# Patient Record
Sex: Female | Born: 1935
Health system: Southern US, Community
[De-identification: ages and names within clinical notes are randomized; demographics above are authoritative.]

## PROBLEM LIST (undated history)

## (undated) DIAGNOSIS — K219 Gastro-esophageal reflux disease without esophagitis: Secondary | ICD-10-CM

## (undated) DIAGNOSIS — E785 Hyperlipidemia, unspecified: Secondary | ICD-10-CM

## (undated) DIAGNOSIS — I10 Essential (primary) hypertension: Secondary | ICD-10-CM

## (undated) DIAGNOSIS — H269 Unspecified cataract: Secondary | ICD-10-CM

## (undated) HISTORY — DX: Unspecified cataract: H26.9

## (undated) HISTORY — PX: CATARACT EXTRACTION, BILATERAL: SHX1313

## (undated) HISTORY — PX: APPENDECTOMY: SHX54

## (undated) HISTORY — PX: CHOLECYSTECTOMY: SHX55

## (undated) HISTORY — DX: Hyperlipidemia, unspecified: E78.5

## (undated) HISTORY — PX: BLADDER REPAIR: SHX76

## (undated) HISTORY — DX: Essential (primary) hypertension: I10

---

## 2016-06-22 ENCOUNTER — Encounter: Payer: Self-pay | Admitting: Family

## 2017-11-25 DIAGNOSIS — I10 Essential (primary) hypertension: Secondary | ICD-10-CM | POA: Diagnosis not present

## 2017-11-25 DIAGNOSIS — R739 Hyperglycemia, unspecified: Secondary | ICD-10-CM | POA: Diagnosis not present

## 2017-11-25 DIAGNOSIS — E7841 Elevated Lipoprotein(a): Secondary | ICD-10-CM | POA: Diagnosis not present

## 2017-12-03 DIAGNOSIS — H35443 Age-related reticular degeneration of retina, bilateral: Secondary | ICD-10-CM | POA: Diagnosis not present

## 2017-12-03 DIAGNOSIS — H401131 Primary open-angle glaucoma, bilateral, mild stage: Secondary | ICD-10-CM | POA: Diagnosis not present

## 2017-12-03 DIAGNOSIS — H35373 Puckering of macula, bilateral: Secondary | ICD-10-CM | POA: Diagnosis not present

## 2017-12-03 DIAGNOSIS — Z961 Presence of intraocular lens: Secondary | ICD-10-CM | POA: Diagnosis not present

## 2017-12-03 DIAGNOSIS — Z0101 Encounter for examination of eyes and vision with abnormal findings: Secondary | ICD-10-CM | POA: Diagnosis not present

## 2017-12-26 DIAGNOSIS — R739 Hyperglycemia, unspecified: Secondary | ICD-10-CM | POA: Diagnosis not present

## 2017-12-26 DIAGNOSIS — E7849 Other hyperlipidemia: Secondary | ICD-10-CM | POA: Diagnosis not present

## 2017-12-26 DIAGNOSIS — K219 Gastro-esophageal reflux disease without esophagitis: Secondary | ICD-10-CM | POA: Diagnosis not present

## 2017-12-26 DIAGNOSIS — I10 Essential (primary) hypertension: Secondary | ICD-10-CM | POA: Diagnosis not present

## 2018-05-07 ENCOUNTER — Telehealth: Payer: Self-pay | Admitting: Family

## 2018-05-07 NOTE — Telephone Encounter (Signed)
noted 

## 2018-05-16 ENCOUNTER — Encounter: Payer: Self-pay | Admitting: Family

## 2018-05-16 ENCOUNTER — Telehealth: Payer: Self-pay | Admitting: Family

## 2018-05-16 ENCOUNTER — Ambulatory Visit (INDEPENDENT_AMBULATORY_CARE_PROVIDER_SITE_OTHER): Payer: Medicare HMO | Admitting: Family

## 2018-05-16 VITALS — BP 136/63 | HR 54 | Temp 97.5°F | Ht 59.25 in | Wt 170.6 lb

## 2018-05-16 DIAGNOSIS — K219 Gastro-esophageal reflux disease without esophagitis: Secondary | ICD-10-CM | POA: Insufficient documentation

## 2018-05-16 DIAGNOSIS — Z0000 Encounter for general adult medical examination without abnormal findings: Secondary | ICD-10-CM | POA: Diagnosis not present

## 2018-05-16 DIAGNOSIS — E785 Hyperlipidemia, unspecified: Secondary | ICD-10-CM

## 2018-05-16 DIAGNOSIS — H9193 Unspecified hearing loss, bilateral: Secondary | ICD-10-CM | POA: Diagnosis not present

## 2018-05-16 DIAGNOSIS — E669 Obesity, unspecified: Secondary | ICD-10-CM | POA: Diagnosis not present

## 2018-05-16 DIAGNOSIS — I10 Essential (primary) hypertension: Secondary | ICD-10-CM | POA: Diagnosis not present

## 2018-05-16 NOTE — Progress Notes (Signed)
Subjective:    Patient ID: Carolyn Parsons, female    DOB: 1932-02-27, 84 y.o.   MRN: [id number]  Chief Complaint  Patient presents with  . New Patient (Initial Visit)    lab work   Pt presents to the office today to establish care and CPE. Pt is complaining of worsening of hearing loss. States she has hearing loss since 1992.   Pt states she has recently moved in with her daughter and son-in-law from [place].  Hypertension  This is a chronic problem. The current episode started more than 1 year ago. The problem has been resolved since onset. The problem is controlled. Pertinent negatives include no headaches, malaise/fatigue or shortness of breath. Risk factors for coronary artery disease include dyslipidemia and sedentary lifestyle. The current treatment provides moderate improvement. There is no history of kidney disease, CAD/MI, CVA or heart failure.  Gastroesophageal Reflux  She reports no belching, no coughing or no heartburn. This is a chronic problem. The current episode started more than 1 year ago. The problem occurs occasionally. The problem has been waxing and waning. Risk factors include obesity. She has tried a PPI for the symptoms. The treatment provided moderate relief.  Hyperlipidemia  This is a chronic problem. The current episode started more than 1 year ago. The problem is controlled. Recent lipid tests were reviewed and are normal. Exacerbating diseases include obesity. Pertinent negatives include no shortness of breath. Current antihyperlipidemic treatment includes statins. The current treatment provides moderate improvement of lipids. Risk factors for coronary artery disease include dyslipidemia, diabetes mellitus, hypertension and a sedentary lifestyle.      Review of Systems  Constitutional: Negative for malaise/fatigue.  Respiratory: Negative for cough and shortness of breath.   Gastrointestinal: Negative for heartburn.  Neurological: Negative for headaches.    All other systems reviewed and are negative.  Family History  Problem Relation Age of Onset  . Cancer Mother   . Cancer Father     Social History   Socioeconomic History  . Marital status: Widowed    Spouse name: Not on file  . Number of children: Not on file  . Years of education: Not on file  . Highest education level: Not on file  Occupational History  . Not on file  Social Needs  . Financial resource strain: Not on file  . Food insecurity:    Worry: Not on file    Inability: Not on file  . Transportation needs:    Medical: Not on file    Non-medical: Not on file  Tobacco Use  . Smoking status: Former Research scientist (life sciences)  . Smokeless tobacco: Never Used  Substance and Sexual Activity  . Alcohol use: Never    Frequency: Never  . Drug use: Never  . Sexual activity: Not on file  Lifestyle  . Physical activity:    Days per week: Not on file    Minutes per session: Not on file  . Stress: Not on file  Relationships  . Social connections:    Talks on phone: Not on file    Gets together: Not on file    Attends religious service: Not on file    Active member of club or organization: Not on file    Attends meetings of clubs or organizations: Not on file    Relationship status: Not on file  Other Topics Concern  . Not on file  Social History Narrative  . Not on file       Objective:  Physical Exam  Constitutional: She is oriented to person, place, and time. She appears well-developed and well-nourished. No distress.  HENT:  Head: Normocephalic and atraumatic.  Right Ear: External ear normal.  Left Ear: External ear normal.  Mouth/Throat: Oropharynx is clear and moist.  Eyes: Pupils are equal, round, and reactive to light.  Neck: Normal range of motion. Neck supple. No thyromegaly present.  Cardiovascular: Normal rate, regular rhythm, normal heart sounds and intact distal pulses.  No murmur heard. Pulmonary/Chest: Effort normal and breath sounds normal. No respiratory  distress. She has no wheezes.  Abdominal: Soft. Bowel sounds are normal. She exhibits no distension. There is no tenderness.  Musculoskeletal: Normal range of motion. She exhibits no edema or tenderness.  Neurological: She is alert and oriented to person, place, and time. She has normal reflexes. No cranial nerve deficit.  Skin: Skin is warm and dry.  Psychiatric: She has a normal mood and affect. Her behavior is normal. Judgment and thought content normal.  Vitals reviewed.     BP 136/63   Pulse (!) 54   Temp (!) 97.5 F (36.4 C) (Oral)   Ht 4' 11.25" (1.505 m)   Wt 170 lb 9.6 oz (77.4 kg)   BMI 34.17 kg/m      Assessment & Plan:  Carolyn Parsons comes in today with chief complaint of New Patient (Initial Visit) (lab work)   Diagnosis and orders addressed:  1. Annual physical exam - CBC with Differential/Platelet - CMP14+EGFR - Lipid panel - TSH - Ambulatory referral to Audiology  2. Hyperlipidemia, unspecified hyperlipidemia type - CBC with Differential/Platelet - CMP14+EGFR - Lipid panel  3. Gastroesophageal reflux disease, esophagitis presence not specified - CBC with Differential/Platelet - CMP14+EGFR  4. Essential hypertension - CBC with Differential/Platelet - CMP14+EGFR  5. Obesity (BMI 30.0-34.9) - CBC with Differential/Platelet - CMP14+EGFR  6. Bilateral hearing loss, unspecified hearing loss type - CBC with Differential/Platelet - CMP14+EGFR - Ambulatory referral to Audiology   Labs pending Health Maintenance reviewed- Pt states she is up to date on immunizations, we will update chart when we get her office notes from [place] Diet and exercise encouraged  Follow up plan: 6 months    Evelina Dun, FNP

## 2018-05-16 NOTE — Patient Instructions (Signed)

## 2018-05-17 LAB — CMP14+EGFR
A/G RATIO: 1.6 (ref 1.2–2.2)
ALT: 10 IU/L (ref 0–32)
AST: 15 IU/L (ref 0–40)
Albumin: 4.2 g/dL (ref 3.5–4.7)
Alkaline Phosphatase: 73 IU/L (ref 39–117)
BUN/Creatinine Ratio: 21 (ref 12–28)
BUN: 19 mg/dL (ref 8–27)
Bilirubin Total: 0.5 mg/dL (ref 0.0–1.2)
CALCIUM: 9.2 mg/dL (ref 8.7–10.3)
CHLORIDE: 103 mmol/L (ref 96–106)
CO2: 22 mmol/L (ref 20–29)
Creatinine, Ser: 0.92 mg/dL (ref 0.57–1.00)
GFR calc Af Amer: 67 mL/min/{1.73_m2} (ref >59)
GFR, EST NON AFRICAN AMERICAN: 58 mL/min/{1.73_m2} — AB (ref >59)
Globulin, Total: 2.7 g/dL (ref 1.5–4.5)
Glucose: 104 mg/dL — ABNORMAL HIGH (ref 65–99)
POTASSIUM: 4.9 mmol/L (ref 3.5–5.2)
Sodium: 141 mmol/L (ref 134–144)
Total Protein: 6.9 g/dL (ref 6.0–8.5)

## 2018-05-17 LAB — CBC WITH DIFFERENTIAL/PLATELET
BASOS: 0 %
Basophils Absolute: 0 10*3/uL (ref 0.0–0.2)
EOS (ABSOLUTE): 0.2 10*3/uL (ref 0.0–0.4)
Eos: 2 %
Hematocrit: 41.6 % (ref 34.0–46.6)
Hemoglobin: 14.1 g/dL (ref 11.1–15.9)
IMMATURE GRANULOCYTES: 0 %
Immature Grans (Abs): 0 10*3/uL (ref 0.0–0.1)
Lymphocytes Absolute: 2.1 10*3/uL (ref 0.7–3.1)
Lymphs: 23 %
MCH: 29.8 pg (ref 26.6–33.0)
MCHC: 33.9 g/dL (ref 31.5–35.7)
MCV: 88 fL (ref 79–97)
MONOS ABS: 0.7 10*3/uL (ref 0.1–0.9)
Monocytes: 7 %
NEUTROS PCT: 68 %
Neutrophils Absolute: 6.2 10*3/uL (ref 1.4–7.0)
Platelets: 228 10*3/uL (ref 150–450)
RBC: 4.73 x10E6/uL (ref 3.77–5.28)
RDW: 12.4 % (ref 12.3–15.4)
WBC: 9.2 10*3/uL (ref 3.4–10.8)

## 2018-05-17 LAB — LIPID PANEL
Chol/HDL Ratio: 3.2 ratio (ref 0.0–4.4)
Cholesterol, Total: 116 mg/dL (ref 100–199)
HDL: 36 mg/dL — AB (ref >39)
LDL CALC: 57 mg/dL (ref 0–99)
Triglycerides: 114 mg/dL (ref 0–149)
VLDL CHOLESTEROL CAL: 23 mg/dL (ref 5–40)

## 2018-05-17 LAB — TSH: TSH: 6.62 u[IU]/mL — AB (ref 0.450–4.500)

## 2018-05-19 ENCOUNTER — Other Ambulatory Visit: Payer: Self-pay | Admitting: Family

## 2018-05-19 ENCOUNTER — Other Ambulatory Visit (INDEPENDENT_AMBULATORY_CARE_PROVIDER_SITE_OTHER): Payer: Self-pay

## 2018-05-19 ENCOUNTER — Ambulatory Visit (INDEPENDENT_AMBULATORY_CARE_PROVIDER_SITE_OTHER): Payer: Medicare HMO | Admitting: Orthopaedic Surgery

## 2018-05-19 ENCOUNTER — Ambulatory Visit (INDEPENDENT_AMBULATORY_CARE_PROVIDER_SITE_OTHER): Payer: Medicare HMO

## 2018-05-19 ENCOUNTER — Encounter (INDEPENDENT_AMBULATORY_CARE_PROVIDER_SITE_OTHER): Payer: Self-pay | Admitting: Orthopaedic Surgery

## 2018-05-19 DIAGNOSIS — M5442 Lumbago with sciatica, left side: Secondary | ICD-10-CM | POA: Diagnosis not present

## 2018-05-19 DIAGNOSIS — M4807 Spinal stenosis, lumbosacral region: Secondary | ICD-10-CM

## 2018-05-19 MED ORDER — LEVOTHYROXINE SODIUM 25 MCG PO TABS: 25.0000 ug | ORAL_TABLET | Freq: Every day | ORAL | 1 refills | Status: DC

## 2018-05-19 MED ORDER — METHYLPREDNISOLONE 4 MG PO TABS: ORAL_TABLET | ORAL | 0 refills | Status: DC

## 2018-05-19 MED ORDER — HYDROCODONE-ACETAMINOPHEN 5-325 MG PO TABS: 1.0000 | ORAL_TABLET | Freq: Four times a day (QID) | ORAL | 0 refills | Status: DC | PRN

## 2018-05-19 NOTE — Progress Notes (Signed)
Office Visit Note   Patient: Carolyn Parsons           Date of Birth: 10/07/1932           MRN: [id number] Visit Date: 05/19/2018              Requested by: Junie SpencerHawks, Christy A, FNP [address] [place], [place] [id number] PCP: Junie SpencerHawks, Christy A, FNP   Assessment & Plan: Visit Diagnoses:  1. Acute left-sided low back pain with left-sided sciatica     Plan: Based on her clinical exam findings and plain film findings I am recommending an MRI of the lumbar spine to assess for nerve compression that can hopefully help guide wearing intervention should be performed.  She will likely need some type of nerve root injection at most likely L4-L5 level that corresponds with her pain and clinical exam findings.  In the interim I will put her on a 6-day steroid taper and some hydrocodone for pain.  All question concerns were answered and addressed.  Obviously if this becomes more severe acute and she develops rapid weakness we should see her earlier or she should go to the emergency room.  Otherwise we will see her back in 2 weeks to go the MRI.  Follow-Up Instructions: Return in about 2 weeks (around 06/02/2018).   Orders:  Orders Placed This Encounter  Procedures  . XR Lumbar Spine 2-3 Views   Meds ordered this encounter  Medications  . methylPREDNISolone (MEDROL) 4 MG tablet    Sig: Medrol dose pack. Take as instructed    Dispense:  21 tablet    Refill:  0  . HYDROcodone-acetaminophen (NORCO/VICODIN) 5-325 MG tablet    Sig: Take 1-2 tablets by mouth every 6 (six) hours as needed for moderate pain.    Dispense:  30 tablet    Refill:  0      Procedures: No procedures performed   Clinical Data: No additional findings.   Subjective: Chief Complaint  Patient presents with  . Left Hip - Pain  Patient is a very pleasant 84 year old I am seeing for the first time in our office.  Her family though is well-known to the office and they have seen Dr. Ophelia CharterYates and Dr. Alvester MorinNewton before.  She is  someone is acute left-sided low back pain and left-sided sciatica.  She points to her sciatic region is a source of her pain but it does radiate to behind the knee and into the top of her foot.  This is come on more suddenly over the last few days.  She is an active individual.  She is only been living in [place] about [place] days now.  She is not a diabetic and denies any acute injury.  Her pain got severe enough last night she could not sleep well.  HPI  Review of Systems She currently denies any headache, chest pain, shortness of breath, fever, chills, nausea, vomiting.  Objective: Vital Signs: There were no vitals taken for this visit.  Physical Exam She is alert and oriented x3 and in no acute distress but obvious discomfort.  She is ambulating without any type of assistive device. Ortho Exam Examination of her low back and bilateral lower extremity shows a positive straight leg raise to the left side.  She has decreased sensation of the lateral aspect of her left leg and the top of her left foot when these are compared to her opposite side.  She has good strength though it are  equal in the bilateral lower extremities and normal reflexes. Specialty Comments:  No specialty comments available.  Imaging: Xr Lumbar Spine 2-3 Views  Result Date: 05/19/2018 An AP and lateral lumbar spine shows a grade 1 spondylolisthesis at L4-L5.  There is degenerative disc disease and significant arthritic changes at L5-S1.    PMFS History: Patient Active Problem List   Diagnosis Date Noted  . Hyperlipemia 05/16/2018  . GERD (gastroesophageal reflux disease) 05/16/2018  . Hypertension 05/16/2018  . Obesity (BMI 30.0-34.9) 05/16/2018   Past Medical History:  Diagnosis Date  . Cataract   . Hyperlipidemia   . Hypertension     Family History  Problem Relation Age of Onset  . Cancer Mother   . Cancer Father     Past Surgical History:  Procedure Laterality Date  . APPENDECTOMY    .  CHOLECYSTECTOMY     Social History   Occupational History  . Not on file  Tobacco Use  . Smoking status: Former Games developer  . Smokeless tobacco: Never Used  Substance and Sexual Activity  . Alcohol use: Never    Frequency: Never  . Drug use: Never  . Sexual activity: Not on file

## 2018-05-23 ENCOUNTER — Other Ambulatory Visit: Payer: Self-pay | Admitting: *Deleted

## 2018-05-23 DIAGNOSIS — R7989 Other specified abnormal findings of blood chemistry: Secondary | ICD-10-CM

## 2018-05-26 ENCOUNTER — Ambulatory Visit
Admission: RE | Admit: 2018-05-26 | Discharge: 2018-05-26 | Disposition: A | Discharge status: Home / Self Care | Payer: Medicare HMO | Source: Ambulatory Visit | Attending: Orthopaedic Surgery | Admitting: Orthopaedic Surgery

## 2018-05-26 DIAGNOSIS — M48061 Spinal stenosis, lumbar region without neurogenic claudication: Secondary | ICD-10-CM | POA: Diagnosis not present

## 2018-05-26 DIAGNOSIS — M4807 Spinal stenosis, lumbosacral region: Secondary | ICD-10-CM

## 2018-05-28 ENCOUNTER — Ambulatory Visit (INDEPENDENT_AMBULATORY_CARE_PROVIDER_SITE_OTHER): Payer: Medicare HMO | Admitting: Physician Assistant

## 2018-05-28 ENCOUNTER — Encounter (INDEPENDENT_AMBULATORY_CARE_PROVIDER_SITE_OTHER): Payer: Self-pay | Admitting: Physician Assistant

## 2018-05-28 ENCOUNTER — Ambulatory Visit (INDEPENDENT_AMBULATORY_CARE_PROVIDER_SITE_OTHER): Payer: Medicare HMO

## 2018-05-28 DIAGNOSIS — M545 Low back pain: Secondary | ICD-10-CM

## 2018-05-28 NOTE — Progress Notes (Signed)
Office Visit Note   Patient: Carolyn Parsons           Date of Birth: 11/12/1931           MRN: [id number] Visit Date: 05/28/2018              Requested by: Junie SpencerHawks, Christy A, FNP [address] [place], [place] [id number] PCP: Junie SpencerHawks, Christy A, FNP   Assessment & Plan: Visit Diagnoses:  1. Acute low back pain, unspecified back pain laterality, with sciatica presence unspecified     Plan: We will send her to Dr. Alvester MorinNewton for lumbar epidural steroid injection.  Have her follow-up with Dr. Magnus IvanBlackman 4 weeks after the injection see her response.  Questions encouraged and answered at length today both the patient and her daughters present throughout the examination and review of MRI.  Follow-Up Instructions: Return for 4 weeks after epidural steroid injection.   Orders:  Orders Placed This Encounter  Procedures  . XR Lumbar Spine 2-3 Views   No orders of the defined types were placed in this encounter.     Procedures: No procedures performed   Clinical Data: No additional findings.   Subjective: Chief Complaint  Patient presents with  . Lower Back - Follow-up  . MRI    HPI Carolyn Parsons is an 84 year old female was seen back today to go over MRI of her lumbar spine.  She states that her back pain is keeping her awake at night.  She is mostly having pain down the lateral aspect of her left thigh into her lower leg and into the anterior aspect of her left ankle.  She does have an occasional sharp burning pain anterior left lower leg.  No numbness tingling down either leg.  Pain is worse with walking.  Rarely has any back pain.  No change in bowel bladder function she does have some urinary incontinence.  MRI of the lumbar spine is reviewed with the patient using a spine model also the MRI images are used for visualization.  Disc space narrowing is seen at L1-2 and L2-3.  At L1 -L2 central disc extrusion with mild stenosis possible left L2 nerve or impingement.  L2-3 central disc  protrusion right greater than left L3 neural impingement.  L3-4 central disc protrusion moderate stenosis.  Bilateral L4 neural impingement.  L4-5 5 mm anterolisthesis.  Mild to moderate stenosis.   Review of Systems Please see HPI otherwise negative Objective: Vital Signs: There were no vitals taken for this visit.  Physical Exam  Constitutional: She is oriented to person, place, and time. She appears well-developed and well-nourished. No distress.  Pulmonary/Chest: Effort normal.  Neurological: She is alert and oriented to person, place, and time.  Skin: She is not diaphoretic.  Psychiatric: She has a normal mood and affect.    Ortho Exam Bilateral lower extremities she has positive straight leg raise on the left negative on the right.  5 out of 5 strength throughout lower extremities.  Subjective decreased sensation over the left anterior ankle. Specialty Comments:  No specialty comments available.  Imaging: Xr Lumbar Spine 2-3 Views  Result Date: 05/28/2018 Lateral flexion-extension views of the lumbar spine: L4-L5 spondylolisthesis shows no sign of instability.    PMFS History: Patient Active Problem List   Diagnosis Date Noted  . Hyperlipemia 05/16/2018  . GERD (gastroesophageal reflux disease) 05/16/2018  . Hypertension 05/16/2018  . Obesity (BMI 30.0-34.9) 05/16/2018   Past Medical History:  Diagnosis Date  . Cataract   .  Hyperlipidemia   . Hypertension     Family History  Problem Relation Age of Onset  . Cancer Mother   . Cancer Father     Past Surgical History:  Procedure Laterality Date  . APPENDECTOMY    . CHOLECYSTECTOMY     Social History   Occupational History  . Not on file  Tobacco Use  . Smoking status: Former Games developer  . Smokeless tobacco: Never Used  Substance and Sexual Activity  . Alcohol use: Never    Frequency: Never  . Drug use: Never  . Sexual activity: Not on file

## 2018-05-30 ENCOUNTER — Other Ambulatory Visit (INDEPENDENT_AMBULATORY_CARE_PROVIDER_SITE_OTHER): Payer: Self-pay

## 2018-05-30 DIAGNOSIS — G8929 Other chronic pain: Secondary | ICD-10-CM

## 2018-05-30 DIAGNOSIS — M545 Low back pain: Principal | ICD-10-CM

## 2018-06-02 ENCOUNTER — Ambulatory Visit (INDEPENDENT_AMBULATORY_CARE_PROVIDER_SITE_OTHER): Payer: Medicare HMO | Admitting: Physician Assistant

## 2018-06-02 ENCOUNTER — Telehealth (INDEPENDENT_AMBULATORY_CARE_PROVIDER_SITE_OTHER): Payer: Self-pay | Admitting: *Deleted

## 2018-06-02 NOTE — Telephone Encounter (Signed)
Please advise 

## 2018-06-03 ENCOUNTER — Other Ambulatory Visit (INDEPENDENT_AMBULATORY_CARE_PROVIDER_SITE_OTHER): Payer: Self-pay

## 2018-06-03 MED ORDER — HYDROCODONE-ACETAMINOPHEN 5-325 MG PO TABS: 1.0000 | ORAL_TABLET | Freq: Four times a day (QID) | ORAL | 0 refills | Status: DC | PRN

## 2018-06-03 NOTE — Telephone Encounter (Signed)
norco 5/325 mg one - two po q 4-6 hrs prn pain #30 zero

## 2018-06-03 NOTE — Telephone Encounter (Signed)
Patient daughter aware this Rx is ready at front desk

## 2018-06-17 ENCOUNTER — Encounter

## 2018-06-17 ENCOUNTER — Ambulatory Visit (INDEPENDENT_AMBULATORY_CARE_PROVIDER_SITE_OTHER): Payer: Self-pay

## 2018-06-17 ENCOUNTER — Ambulatory Visit (INDEPENDENT_AMBULATORY_CARE_PROVIDER_SITE_OTHER): Payer: Medicare HMO | Admitting: Physical Medicine and Rehabilitation

## 2018-06-17 ENCOUNTER — Encounter (INDEPENDENT_AMBULATORY_CARE_PROVIDER_SITE_OTHER): Payer: Self-pay | Admitting: Physical Medicine and Rehabilitation

## 2018-06-17 VITALS — BP 172/97 | HR 80

## 2018-06-17 DIAGNOSIS — M5416 Radiculopathy, lumbar region: Secondary | ICD-10-CM

## 2018-06-17 MED ORDER — BETAMETHASONE SOD PHOS & ACET 6 (3-3) MG/ML IJ SUSP
12.0000 mg | Freq: Once | INTRAMUSCULAR | Status: AC
  Administered 2018-06-17: 12 mg

## 2018-06-17 NOTE — Patient Instructions (Signed)

## 2018-06-17 NOTE — Progress Notes (Signed)
 .  Numeric Pain Rating Scale and Functional Assessment Average Pain 6   In the last MONTH (on 0-10 scale) has pain interfered with the following?  1. General activity like being  able to carry out your everyday physical activities such as walking, climbing stairs, carrying groceries, or moving a chair?  Rating(4)   +Driver, -BT, -Dye Allergies.  

## 2018-06-26 ENCOUNTER — Telehealth (INDEPENDENT_AMBULATORY_CARE_PROVIDER_SITE_OTHER): Payer: Self-pay | Admitting: Physical Medicine and Rehabilitation

## 2018-06-26 ENCOUNTER — Telehealth (INDEPENDENT_AMBULATORY_CARE_PROVIDER_SITE_OTHER): Payer: Self-pay | Admitting: *Deleted

## 2018-06-26 NOTE — Progress Notes (Signed)
Carolyn StacksFreda Parsons - 84 y.o. female MRN [id number]  Date of birth: 05/29/1932  Office Visit Note: Visit Date: 06/17/2018 PCP: Junie SpencerHawks, Christy A, FNP Referred by: Junie SpencerHawks, Christy A, FNP  Subjective: Chief Complaint  Patient presents with  . Lower Back - Pain  . Left Leg - Pain  . Left Foot - Pain   HPI: Carolyn Parsons is an 84 year old female who comes in today at the request of Dr. Doneen Poissonhristopher Blackman and Rexene EdisonGil Clark, P.A.-C for transforaminal epidural steroid injection diagnostically and therapeutically for ongoing chronic recalcitrant radicular leg pain.  She reports pain in the lower back that radiates in the left leg and left foot and more of an L5 distribution with paresthesias.  She has not noted any focal weakness of foot drop.  She reports it started in July 2019.  She is failed conservative care.  She reports walking makes the pain worse.  They did obtain MRI of the lumbar spine which in the upper lumbar region shows more right-sided disc issues but there is moderate stenosis at L3-4 multifactorial.  Mild to moderate at L4-5 with left to central protrusion but no focal compression.  She does have a sacralized L5 segment.  There is facet arthropathy and arthritis.  Based on symptoms we are going to complete a left L4 transforaminal epidural steroid injection based on a sacralized L5 segment.   ROS Otherwise per HPI.  Assessment & Plan: Visit Diagnoses:  1. Lumbar radiculopathy     Plan: No additional findings.   Meds & Orders:  Meds ordered this encounter  Medications  . betamethasone acetate-betamethasone sodium phosphate (CELESTONE) injection 12 mg    Orders Placed This Encounter  Procedures  . XR C-ARM NO REPORT  . Epidural Steroid injection    Follow-up: Return if symptoms worsen or fail to improve.   Procedures: No procedures performed  Lumbosacral Transforaminal Epidural Steroid Injection - Sub-Pedicular Approach with Fluoroscopic Guidance  Patient: Carolyn Parsons      Date  of Birth: 07/26/1932 MRN: [id number] PCP: Junie SpencerHawks, Christy A, FNP      Visit Date: 06/17/2018   Universal Protocol:    Date/Time: 06/17/2018  Consent Given By: the patient  Position: PRONE  Additional Comments: Vital signs were monitored before and after the procedure. Patient was prepped and draped in the usual sterile fashion. The correct patient, procedure, and site was verified.   Injection Procedure Details:  Procedure Site One Meds Administered:  Meds ordered this encounter  Medications  . betamethasone acetate-betamethasone sodium phosphate (CELESTONE) injection 12 mg    Laterality: Left  Location/Site: Sacralized L5 segment L4-L5  Needle size: 22 G  Needle type: Spinal  Needle Placement: Transforaminal  Findings:    -Comments: Excellent flow of contrast along the nerve and into the epidural space.  Procedure Details: After squaring off the end-plates to get a true AP view, the C-arm was positioned so that an oblique view of the foramen as noted above was visualized. The target area is just inferior to the "nose of the scotty dog" or sub pedicular. The soft tissues overlying this structure were infiltrated with 2-3 ml. of 1% Lidocaine without Epinephrine.  The spinal needle was inserted toward the target using a "trajectory" view along the fluoroscope beam.  Under AP and lateral visualization, the needle was advanced so it did not puncture dura and was located close the 6 O'Clock position of the pedical in AP tracterory. Biplanar projections were used to confirm position. Aspiration was confirmed to be negative  for CSF and/or blood. A 1-2 ml. volume of Isovue-250 was injected and flow of contrast was noted at each level. Radiographs were obtained for documentation purposes.   After attaining the desired flow of contrast documented above, a 0.5 to 1.0 ml test dose of 0.25% Marcaine was injected into each respective transforaminal space.  The patient was observed for 90  seconds post injection.  After no sensory deficits were reported, and normal lower extremity motor function was noted,   the above injectate was administered so that equal amounts of the injectate were placed at each foramen (level) into the transforaminal epidural space.   Additional Comments:  The patient tolerated the procedure well Dressing: Band-Aid    Post-procedure details: Patient was observed during the procedure. Post-procedure instructions were reviewed.  Patient left the clinic in stable condition.    Clinical History: MRI LUMBAR SPINE WITHOUT CONTRAST  TECHNIQUE: Multiplanar, multisequence MR imaging of the lumbar spine was performed. No intravenous contrast was administered.  COMPARISON:  Plain film 05/19/2018.  FINDINGS: Segmentation:  L5 is sacralized.  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  Alignment:  Degenerative anterolisthesis L4 on L5 measures 5 mm.  Vertebrae:  No worrisome osseous lesion.  Endplate reactive changes.  Conus medullaris and cauda equina: Conus extends to the L1 level. Conus and cauda equina appear normal.  Paraspinal and other soft tissues: Negative.  Disc levels:  L1-L2: Disc space narrowing. Central disc extrusion. Mild stenosis. Possible LEFT L2 neural impingement. Disc material does extend into the foramen, but clear-cut L1 neural impingement not established.  L2-L3: Disc space narrowing. Central protrusion. Posterior element hypertrophy. RIGHT greater than LEFT L3 neural impingement.  L3-L4: Disc space narrowing. Central protrusion. Posterior element hypertrophy. Moderate stenosis. BILATERAL L4 neural impingement. Disc material extends into both foramina, and there also and extraforaminal disc herniation on the RIGHT. RIGHT L3 neural impingement is likely.  L4-L5: 5 mm anterolisthesis. Uncovering of the disc with mild bulge. Posterior element hypertrophy. Mild to moderate stenosis. No definite subarticular zone or foraminal  zone narrowing.  L5-S1:  Rudimentary disc space.  No impingement.  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  Mild to moderate stenosis at L4-5 due to 5 mm slip, posterior element hypertrophy, and uncovering of the disc with mild bulge. No definite subarticular zone or foraminal zone narrowing. Consider lateral standing flexion extension lumbar radiographs to evaluate for dynamic instability.  Moderate stenosis at L3-4 related to disc space narrowing, central protrusion, and posterior element hypertrophy. BILATERAL L4 neural impingement in the canal, with additional RIGHT L3 neural impingement likely due to extraforaminal disc herniation.  Central protrusion at L2-3, RIGHT greater than LEFT L3 neural impingement.  Central extrusion L1-2, possible LEFT L2 neural impingement.   Electronically Signed   By: Elsie Stain M.D.   On: 05/26/2018 14:38   She reports that she has quit smoking. She has never used smokeless tobacco. No results for input(s): HGBA1C, LABURIC in the last 8760 hours.  Objective:  VS:  HT:    WT:   BMI:     BP:(!) 172/97  HR:80bpm  TEMP: ( )  RESP:  Physical Exam  Ortho Exam Imaging: No results found.  Past Medical/Family/Surgical/Social History: Medications & Allergies reviewed per EMR, new medications updated. Patient Active Problem List   Diagnosis Date Noted  . Hyperlipemia 05/16/2018  . GERD (gastroesophageal reflux disease) 05/16/2018  . Hypertension 05/16/2018  . Obesity (BMI 30.0-34.9) 05/16/2018   Past Medical History:  Diagnosis Date  . Cataract   .  Hyperlipidemia   . Hypertension    Family History  Problem Relation Age of Onset  . Cancer Mother   . Cancer Father    Past Surgical History:  Procedure Laterality Date  . APPENDECTOMY    . CHOLECYSTECTOMY     Social History   Occupational History  . Not on file  Tobacco Use  . Smoking status: Former Games developer  . Smokeless tobacco: Never Used  Substance and  Sexual Activity  . Alcohol use: Never    Frequency: Never  . Drug use: Never  . Sexual activity: Not on file

## 2018-06-26 NOTE — Telephone Encounter (Signed)
Please advise 

## 2018-06-26 NOTE — Telephone Encounter (Signed)
Find out if she wants tramadol or Tylenol 3.  We would not put her on anything stronger at this point.  Either would be 1-2 every 8 hours as needed for pain and only 30 pills.

## 2018-06-26 NOTE — Telephone Encounter (Signed)
Authorization [id number]:[id number] Auth Effective Date:06/26/2018 Auth End Date:09/24/2018

## 2018-06-26 NOTE — Procedures (Signed)
Lumbosacral Transforaminal Epidural Steroid Injection - Sub-Pedicular Approach with Fluoroscopic Guidance  Patient: Carolyn StacksFreda Parsons      Date of Birth: 06/08/1932 MRN: [id number] PCP: Junie SpencerHawks, Christy A, FNP      Visit Date: 06/17/2018   Universal Protocol:    Date/Time: 06/17/2018  Consent Given By: the patient  Position: PRONE  Additional Comments: Vital signs were monitored before and after the procedure. Patient was prepped and draped in the usual sterile fashion. The correct patient, procedure, and site was verified.   Injection Procedure Details:  Procedure Site One Meds Administered:  Meds ordered this encounter  Medications  . betamethasone acetate-betamethasone sodium phosphate (CELESTONE) injection 12 mg    Laterality: Left  Location/Site: Sacralized L5 segment L4-L5  Needle size: 22 G  Needle type: Spinal  Needle Placement: Transforaminal  Findings:    -Comments: Excellent flow of contrast along the nerve and into the epidural space.  Procedure Details: After squaring off the end-plates to get a true AP view, the C-arm was positioned so that an oblique view of the foramen as noted above was visualized. The target area is just inferior to the "nose of the scotty dog" or sub pedicular. The soft tissues overlying this structure were infiltrated with 2-3 ml. of 1% Lidocaine without Epinephrine.  The spinal needle was inserted toward the target using a "trajectory" view along the fluoroscope beam.  Under AP and lateral visualization, the needle was advanced so it did not puncture dura and was located close the 6 O'Clock position of the pedical in AP tracterory. Biplanar projections were used to confirm position. Aspiration was confirmed to be negative for CSF and/or blood. A 1-2 ml. volume of Isovue-250 was injected and flow of contrast was noted at each level. Radiographs were obtained for documentation purposes.   After attaining the desired flow of contrast  documented above, a 0.5 to 1.0 ml test dose of 0.25% Marcaine was injected into each respective transforaminal space.  The patient was observed for 90 seconds post injection.  After no sensory deficits were reported, and normal lower extremity motor function was noted,   the above injectate was administered so that equal amounts of the injectate were placed at each foramen (level) into the transforaminal epidural space.   Additional Comments:  The patient tolerated the procedure well Dressing: Band-Aid    Post-procedure details: Patient was observed during the procedure. Post-procedure instructions were reviewed.  Patient left the clinic in stable condition.

## 2018-06-26 NOTE — Telephone Encounter (Signed)
Repeat but do two level as patient has transitional sacralized L5

## 2018-06-26 NOTE — Telephone Encounter (Signed)
Needs auth for [id number] and [id number].

## 2018-06-27 ENCOUNTER — Other Ambulatory Visit (INDEPENDENT_AMBULATORY_CARE_PROVIDER_SITE_OTHER): Payer: Self-pay

## 2018-06-27 MED ORDER — TRAMADOL HCL 50 MG PO TABS: 50.0000 mg | ORAL_TABLET | Freq: Three times a day (TID) | ORAL | 0 refills | Status: DC | PRN

## 2018-06-27 NOTE — Telephone Encounter (Signed)
Sent into pharmacy  Patient aware

## 2018-07-11 ENCOUNTER — Encounter (INDEPENDENT_AMBULATORY_CARE_PROVIDER_SITE_OTHER): Payer: Self-pay | Admitting: Physical Medicine and Rehabilitation

## 2018-07-11 ENCOUNTER — Ambulatory Visit (INDEPENDENT_AMBULATORY_CARE_PROVIDER_SITE_OTHER): Payer: Medicare HMO | Admitting: Physical Medicine and Rehabilitation

## 2018-07-11 ENCOUNTER — Ambulatory Visit (INDEPENDENT_AMBULATORY_CARE_PROVIDER_SITE_OTHER): Payer: Self-pay

## 2018-07-11 VITALS — BP 163/79 | HR 77

## 2018-07-11 DIAGNOSIS — M48062 Spinal stenosis, lumbar region with neurogenic claudication: Secondary | ICD-10-CM

## 2018-07-11 DIAGNOSIS — M5416 Radiculopathy, lumbar region: Secondary | ICD-10-CM | POA: Diagnosis not present

## 2018-07-11 MED ORDER — BETAMETHASONE SOD PHOS & ACET 6 (3-3) MG/ML IJ SUSP
12.0000 mg | Freq: Once | INTRAMUSCULAR | Status: AC
  Administered 2018-07-11: 12 mg

## 2018-07-11 NOTE — Progress Notes (Signed)
.  Numeric Pain Rating Scale and Functional Assessment Average Pain 6   In the last MONTH (on 0-10 scale) has pain interfered with the following?  1. General activity like being  able to carry out your everyday physical activities such as walking, climbing stairs, carrying groceries, or moving a chair?  Rating(5)   +Driver, -BT, -Dye Allergies.  

## 2018-07-11 NOTE — Patient Instructions (Signed)

## 2018-07-23 ENCOUNTER — Other Ambulatory Visit: Payer: Self-pay | Admitting: Family

## 2018-07-24 ENCOUNTER — Other Ambulatory Visit: Payer: Medicare HMO

## 2018-07-24 DIAGNOSIS — R7989 Other specified abnormal findings of blood chemistry: Secondary | ICD-10-CM | POA: Diagnosis not present

## 2018-07-24 DIAGNOSIS — I10 Essential (primary) hypertension: Secondary | ICD-10-CM | POA: Diagnosis not present

## 2018-07-24 NOTE — Progress Notes (Signed)
Carolyn Parsons - 83 y.o. female MRN [id number]  Date of birth: 06-Aug-1932  Office Visit Note: Visit Date: 07/11/2018 PCP: [name], FNP Referred by: [name], FNP  Subjective: No chief complaint on file.  HPI: Carolyn Parsons is an 84 year old female who comes in today with continued low back pain.  She is followed by of Dr. Doneen Poisson and [name], P.A.-C for orthopedic complaints.  She reports pain in the lower back.  She has not noted any focal weakness of foot drop.  She reports it started in July 2019.  She is failed conservative care.  She reports walking makes the pain worse.  She has had physical therapy.  She does use mild opioid medication.  She reports that the left transforaminal injection at L4 provided significant relief of her leg pain and paresthesia.  She no longer really having much of that.  She still gets some thigh pain with walking and low back pain with standing.  Relief at rest.  I think the best approach is bilateral L3 transforaminal injection just to see if one more injection at the level of stenosis will get her good relief.  She has a transitional segment.  Depending on relief of back pain would look at diagnostic medial branch blocks with goal towards radiofrequency ablation.  She has concordant pain with facet joint loading and standing.  She has had ongoing pain for many months worsening chronically for a long time.  MRI reviewed below.   ROS Otherwise per HPI.  Assessment & Plan: Visit Diagnoses:  1. Lumbar radiculopathy   2. Spinal stenosis of lumbar region with neurogenic claudication     Plan: No additional findings.   Meds & Orders:  Meds ordered this encounter  Medications  . betamethasone acetate-betamethasone sodium phosphate (CELESTONE) injection 12 mg    Orders Placed This Encounter  Procedures  . XR C-ARM NO REPORT  . Epidural Steroid injection    Follow-up: Return if symptoms worsen or fail to improve, for consider facet  block.   Procedures: No procedures performed  Lumbosacral Transforaminal Epidural Steroid Injection - Sub-Pedicular Approach with Fluoroscopic Guidance  Patient: Carolyn Parsons      Date of Birth: 08-28-1932 MRN: [id number] PCP: [name], FNP      Visit Date: 07/11/2018   Universal Protocol:    Date/Time: 07/11/2018  Consent Given By: the patient  Position: PRONE  Additional Comments: Vital signs were monitored before and after the procedure. Patient was prepped and draped in the usual sterile fashion. The correct patient, procedure, and site was verified.   Injection Procedure Details:  Procedure Site One Meds Administered:  Meds ordered this encounter  Medications  . betamethasone acetate-betamethasone sodium phosphate (CELESTONE) injection 12 mg    Laterality: Bilateral  Location/Site: Sacralized L5 segment L3-L4  Needle size: 22 G  Needle type: Spinal  Needle Placement: Transforaminal  Findings:    -Comments: Excellent flow of contrast along the nerve and into the epidural space.  Procedure Details: After squaring off the end-plates to get a true AP view, the C-arm was positioned so that an oblique view of the foramen as noted above was visualized. The target area is just inferior to the "nose of the scotty dog" or sub pedicular. The soft tissues overlying this structure were infiltrated with 2-3 ml. of 1% Lidocaine without Epinephrine.  The spinal needle was inserted toward the target using a "trajectory" view along the fluoroscope beam.  Under AP and lateral visualization, the  needle was advanced so it did not puncture dura and was located close the 6 O'Clock position of the pedical in AP tracterory. Biplanar projections were used to confirm position. Aspiration was confirmed to be negative for CSF and/or blood. A 1-2 ml. volume of Isovue-250 was injected and flow of contrast was noted at each level. Radiographs were obtained for documentation purposes.    After attaining the desired flow of contrast documented above, a 0.5 to 1.0 ml test dose of 0.25% Marcaine was injected into each respective transforaminal space.  The patient was observed for 90 seconds post injection.  After no sensory deficits were reported, and normal lower extremity motor function was noted,   the above injectate was administered so that equal amounts of the injectate were placed at each foramen (level) into the transforaminal epidural space.   Additional Comments:  The patient tolerated the procedure well Dressing: Band-Aid    Post-procedure details: Patient was observed during the procedure. Post-procedure instructions were reviewed.  Patient left the clinic in stable condition.     Clinical History: MRI LUMBAR SPINE WITHOUT CONTRAST  TECHNIQUE: Multiplanar, multisequence MR imaging of the lumbar spine was performed. No intravenous contrast was administered.  COMPARISON:  Plain film 05/19/2018.  FINDINGS: Segmentation:  L5 is sacralized.  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  Alignment:  Degenerative anterolisthesis L4 on L5 measures 5 mm.  Vertebrae:  No worrisome osseous lesion.  Endplate reactive changes.  Conus medullaris and cauda equina: Conus extends to the L1 level. Conus and cauda equina appear normal.  Paraspinal and other soft tissues: Negative.  Disc levels:  L1-L2: Disc space narrowing. Central disc extrusion. Mild stenosis. Possible LEFT L2 neural impingement. Disc material does extend into the foramen, but clear-cut L1 neural impingement not established.  L2-L3: Disc space narrowing. Central protrusion. Posterior element hypertrophy. RIGHT greater than LEFT L3 neural impingement.  L3-L4: Disc space narrowing. Central protrusion. Posterior element hypertrophy. Moderate stenosis. BILATERAL L4 neural impingement. Disc material extends into both foramina, and there also and extraforaminal disc herniation on the RIGHT. RIGHT L3  neural impingement is likely.  L4-L5: 5 mm anterolisthesis. Uncovering of the disc with mild bulge. Posterior element hypertrophy. Mild to moderate stenosis. No definite subarticular zone or foraminal zone narrowing.  L5-S1:  Rudimentary disc space.  No impingement.  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  Mild to moderate stenosis at L4-5 due to 5 mm slip, posterior element hypertrophy, and uncovering of the disc with mild bulge. No definite subarticular zone or foraminal zone narrowing. Consider lateral standing flexion extension lumbar radiographs to evaluate for dynamic instability.  Moderate stenosis at L3-4 related to disc space narrowing, central protrusion, and posterior element hypertrophy. BILATERAL L4 neural impingement in the canal, with additional RIGHT L3 neural impingement likely due to extraforaminal disc herniation.  Central protrusion at L2-3, RIGHT greater than LEFT L3 neural impingement.  Central extrusion L1-2, possible LEFT L2 neural impingement.   Electronically Signed   By: Elsie Stain M.D.   On: 05/26/2018 14:38   She reports that she has quit smoking. She has never used smokeless tobacco. No results for input(s): HGBA1C, LABURIC in the last 8760 hours.  Objective:  VS:  HT:    WT:   BMI:     BP:(!) 163/79  HR:77bpm  TEMP: ( )  RESP:  Physical Exam  Ortho Exam Imaging: No results found.  Past Medical/Family/Surgical/Social History: Medications & Allergies reviewed per EMR, new medications updated. Patient Active Problem List   Diagnosis  Date Noted  . Hyperlipemia 05/16/2018  . GERD (gastroesophageal reflux disease) 05/16/2018  . Hypertension 05/16/2018  . Obesity (BMI 30.0-34.9) 05/16/2018   Past Medical History:  Diagnosis Date  . Cataract   . Hyperlipidemia   . Hypertension    Family History  Problem Relation Age of Onset  . Cancer Mother   . Cancer Father    Past Surgical History:  Procedure  Laterality Date  . APPENDECTOMY    . CHOLECYSTECTOMY     Social History   Occupational History  . Not on file  Tobacco Use  . Smoking status: Former Games developer  . Smokeless tobacco: Never Used  Substance and Sexual Activity  . Alcohol use: Never    Frequency: Never  . Drug use: Never  . Sexual activity: Not on file

## 2018-07-24 NOTE — Procedures (Signed)
Lumbosacral Transforaminal Epidural Steroid Injection - Sub-Pedicular Approach with Fluoroscopic Guidance  Patient: Carolyn Parsons      Date of Birth: [birth year] MRN: [id number] PCP: [name], FNP      Visit Date: 07/11/2018   Universal Protocol:    Date/Time: 07/11/2018  Consent Given By: the patient  Position: PRONE  Additional Comments: Vital signs were monitored before and after the procedure. Patient was prepped and draped in the usual sterile fashion. The correct patient, procedure, and site was verified.   Injection Procedure Details:  Procedure Site One Meds Administered:  Meds ordered this encounter  Medications  . betamethasone acetate-betamethasone sodium phosphate (CELESTONE) injection 12 mg    Laterality: Bilateral  Location/Site: Sacralized L5 segment L3-L4  Needle size: 22 G  Needle type: Spinal  Needle Placement: Transforaminal  Findings:    -Comments: Excellent flow of contrast along the nerve and into the epidural space.  Procedure Details: After squaring off the end-plates to get a true AP view, the C-arm was positioned so that an oblique view of the foramen as noted above was visualized. The target area is just inferior to the "nose of the scotty dog" or sub pedicular. The soft tissues overlying this structure were infiltrated with 2-3 ml. of 1% Lidocaine without Epinephrine.  The spinal needle was inserted toward the target using a "trajectory" view along the fluoroscope beam.  Under AP and lateral visualization, the needle was advanced so it did not puncture dura and was located close the 6 O'Clock position of the pedical in AP tracterory. Biplanar projections were used to confirm position. Aspiration was confirmed to be negative for CSF and/or blood. A 1-2 ml. volume of Isovue-250 was injected and flow of contrast was noted at each level. Radiographs were obtained for documentation purposes.   After attaining the desired flow of contrast  documented above, a 0.5 to 1.0 ml test dose of 0.25% Marcaine was injected into each respective transforaminal space.  The patient was observed for 90 seconds post injection.  After no sensory deficits were reported, and normal lower extremity motor function was noted,   the above injectate was administered so that equal amounts of the injectate were placed at each foramen (level) into the transforaminal epidural space.   Additional Comments:  The patient tolerated the procedure well Dressing: Band-Aid    Post-procedure details: Patient was observed during the procedure. Post-procedure instructions were reviewed.  Patient left the clinic in stable condition.

## 2018-07-25 LAB — TSH: TSH: 3.23 u[IU]/mL (ref 0.450–4.500)

## 2018-07-28 ENCOUNTER — Telehealth: Payer: Self-pay | Admitting: Family

## 2018-07-28 NOTE — Telephone Encounter (Signed)
Aware, per voice mail left on her phone, yes, can have flu immunization now.  Call us to schedule an appointment .

## 2018-07-28 NOTE — Telephone Encounter (Signed)
PT is scheduled to have VM

## 2018-07-31 ENCOUNTER — Telehealth: Payer: Self-pay | Admitting: Family

## 2018-07-31 NOTE — Telephone Encounter (Signed)
Normal thyroid result. Left message to call for any concerns.

## 2018-08-04 ENCOUNTER — Ambulatory Visit: Payer: Medicare HMO

## 2018-08-07 ENCOUNTER — Ambulatory Visit (INDEPENDENT_AMBULATORY_CARE_PROVIDER_SITE_OTHER): Payer: Medicare HMO

## 2018-08-07 DIAGNOSIS — Z23 Encounter for immunization: Secondary | ICD-10-CM | POA: Diagnosis not present

## 2018-09-21 ENCOUNTER — Other Ambulatory Visit: Payer: Self-pay | Admitting: Family

## 2018-11-04 ENCOUNTER — Encounter: Payer: Self-pay | Admitting: Family Medicine

## 2018-11-04 ENCOUNTER — Ambulatory Visit: Payer: Medicare HMO | Admitting: Family Medicine

## 2018-11-04 VITALS — BP 164/60 | HR 55 | Temp 97.3°F | Ht 59.25 in | Wt 175.5 lb

## 2018-11-04 DIAGNOSIS — J329 Chronic sinusitis, unspecified: Secondary | ICD-10-CM

## 2018-11-04 DIAGNOSIS — J40 Bronchitis, not specified as acute or chronic: Secondary | ICD-10-CM | POA: Diagnosis not present

## 2018-11-04 MED ORDER — LEVOFLOXACIN 500 MG PO TABS: 500.0000 mg | ORAL_TABLET | Freq: Every day | ORAL | 0 refills | Status: DC

## 2018-11-04 MED ORDER — BETAMETHASONE SOD PHOS & ACET 6 (3-3) MG/ML IJ SUSP
6.0000 mg | Freq: Once | INTRAMUSCULAR | Status: AC
  Administered 2018-11-04: 6 mg via INTRAMUSCULAR

## 2018-11-04 NOTE — Progress Notes (Signed)
Chief Complaint  Patient presents with  . Cough    pt here today c/o cough and congestion    HPI  Patient presents today for Patient presents with upper respiratory congestion. Rhinorrhea that is frequently purulent. There is moderate sore throat. Patient reports coughing frequently as well.  yellow sputum noted. There is no fever, chills or sweats. The patient denies being short of breath. Onset was 3-5 days ago. Gradually worsening. Tried OTCs without improvement.  PMH: Smoking status noted ROS: Per HPI  Objective: BP (!) 164/60   Pulse (!) 55   Temp (!) 97.3 F (36.3 C) (Oral)   Ht 4' 11.25" (1.505 m)   Wt 175 lb 8 oz (79.6 kg)   BMI 35.15 kg/m  Gen: NAD, alert, cooperative with exam HEENT: NCAT, Nasal passages swollen, red TMS RED CV: RRR, good S1/S2, no murmur Resp: Bronchitis changes with scattered wheezes, non-labored Ext: No edema, warm Neuro: Alert and oriented, No gross deficits  Assessment and plan:  1. Sinobronchitis     Meds ordered this encounter  Medications  . betamethasone acetate-betamethasone sodium phosphate (CELESTONE) injection 6 mg  . levofloxacin (LEVAQUIN) 500 MG tablet    Sig: Take 1 tablet (500 mg total) by mouth daily.    Dispense:  7 tablet    Refill:  0    No orders of the defined types were placed in this encounter.   Follow up as needed.  Mechele Claude, MD

## 2018-11-14 ENCOUNTER — Encounter: Payer: Self-pay | Admitting: Family

## 2018-11-14 ENCOUNTER — Ambulatory Visit: Payer: Medicare HMO | Admitting: Family

## 2018-11-14 VITALS — BP 137/59 | HR 53 | Temp 97.0°F | Ht 59.5 in | Wt 174.8 lb

## 2018-11-14 DIAGNOSIS — K219 Gastro-esophageal reflux disease without esophagitis: Secondary | ICD-10-CM | POA: Diagnosis not present

## 2018-11-14 DIAGNOSIS — E669 Obesity, unspecified: Secondary | ICD-10-CM

## 2018-11-14 DIAGNOSIS — E785 Hyperlipidemia, unspecified: Secondary | ICD-10-CM | POA: Diagnosis not present

## 2018-11-14 DIAGNOSIS — E039 Hypothyroidism, unspecified: Secondary | ICD-10-CM

## 2018-11-14 DIAGNOSIS — I10 Essential (primary) hypertension: Secondary | ICD-10-CM

## 2018-11-14 MED ORDER — AMLODIPINE BESYLATE 5 MG PO TABS: 5.0000 mg | ORAL_TABLET | Freq: Every day | ORAL | 3 refills | Status: DC

## 2018-11-14 MED ORDER — ROSUVASTATIN CALCIUM 20 MG PO TABS: 20.0000 mg | ORAL_TABLET | Freq: Every day | ORAL | 3 refills | Status: DC

## 2018-11-14 MED ORDER — METOPROLOL SUCCINATE ER 25 MG PO TB24: 25.0000 mg | ORAL_TABLET | Freq: Every day | ORAL | 3 refills | Status: DC

## 2018-11-14 NOTE — Progress Notes (Signed)
Subjective:    Patient ID: Carolyn Parsons, female    DOB: [birth year], 85 y.o.   MRN: [id number]  Chief Complaint  Patient presents with  . Medical Management of Chronic Issues    refills   Pt presents to the office today to establish care and CPE. Pt is complaining of worsening of hearing loss. States she has hearing loss since 1992.   Pt states she has recently moved in with her daughter and son-in-law from [place]. Hypertension  This is a chronic problem. The current episode started more than 1 year ago. The problem has been resolved since onset. The problem is controlled. Pertinent negatives include no headaches, peripheral edema or shortness of breath. Risk factors for coronary artery disease include dyslipidemia and obesity. The current treatment provides moderate improvement. There is no history of kidney disease or heart failure. Identifiable causes of hypertension include a thyroid problem.  Gastroesophageal Reflux  She reports no belching, no coughing, no heartburn or no hoarse voice. This is a chronic problem. The current episode started more than 1 year ago. The problem occurs occasionally. The problem has been waxing and waning. Pertinent negatives include no fatigue. Risk factors include obesity. She has tried an antacid for the symptoms. The treatment provided moderate relief.  Hyperlipidemia  This is a chronic problem. The current episode started more than 1 year ago. The problem is controlled. Recent lipid tests were reviewed and are normal. Exacerbating diseases include obesity. Pertinent negatives include no shortness of breath. Current antihyperlipidemic treatment includes statins. The current treatment provides moderate improvement of lipids. Risk factors for coronary artery disease include dyslipidemia and a sedentary lifestyle.  Thyroid Problem  Presents for follow-up visit. Patient reports no constipation, diarrhea, fatigue or hoarse voice. The symptoms have been stable.  Her past medical history is significant for hyperlipidemia. There is no history of heart failure.      Review of Systems  Constitutional: Negative for fatigue.  HENT: Negative for hoarse voice.   Respiratory: Negative for cough and shortness of breath.   Gastrointestinal: Negative for constipation, diarrhea and heartburn.  Neurological: Negative for headaches.  All other systems reviewed and are negative.      Objective:   Physical Exam Vitals signs reviewed.  Constitutional:      General: She is not in acute distress.    Appearance: She is well-developed.  HENT:     Head: Normocephalic and atraumatic.     Right Ear: Tympanic membrane normal.     Left Ear: Tympanic membrane normal.  Eyes:     Pupils: Pupils are equal, round, and reactive to light.  Neck:     Musculoskeletal: Normal range of motion and neck supple.     Thyroid: No thyromegaly.  Cardiovascular:     Rate and Rhythm: Normal rate and regular rhythm.     Heart sounds: Normal heart sounds. No murmur.  Pulmonary:     Effort: Pulmonary effort is normal. No respiratory distress.     Breath sounds: Normal breath sounds. No wheezing.  Abdominal:     General: Bowel sounds are normal. There is no distension.     Palpations: Abdomen is soft.     Tenderness: There is no abdominal tenderness.  Musculoskeletal: Normal range of motion.        General: No tenderness.     Right lower leg: Edema (trace) present.     Left lower leg: Edema (trace) present.  Skin:    General: Skin is warm and  dry.  Neurological:     Mental Status: She is alert and oriented to person, place, and time.     Cranial Nerves: No cranial nerve deficit.     Deep Tendon Reflexes: Reflexes are normal and symmetric.  Psychiatric:        Behavior: Behavior normal.        Thought Content: Thought content normal.        Judgment: Judgment normal.       BP (!) 137/59   Pulse (!) 53   Temp (!) 97 F (36.1 C) (Oral)   Ht 4' 11.5" (1.511 m)   Wt  174 lb 12.8 oz (79.3 kg)   BMI 34.71 kg/m      Assessment & Plan:  Carolyn Parsons comes in today with chief complaint of Medical Management of Chronic Issues (refills)   Diagnosis and orders addressed:  1. Essential hypertension - amLODipine (NORVASC) 5 MG tablet; Take 1 tablet (5 mg total) by mouth daily.  Dispense: 90 tablet; Refill: 3 - metoprolol succinate (TOPROL-XL) 25 MG 24 hr tablet; Take 1 tablet (25 mg total) by mouth daily.  Dispense: 90 tablet; Refill: 3 - CMP14+EGFR - CBC with Differential/Platelet  2. Gastroesophageal reflux disease, esophagitis presence not specified - CMP14+EGFR - CBC with Differential/Platelet  3. Hyperlipidemia, unspecified hyperlipidemia type - rosuvastatin (CRESTOR) 20 MG tablet; Take 1 tablet (20 mg total) by mouth daily.  Dispense: 90 tablet; Refill: 3 - CMP14+EGFR - CBC with Differential/Platelet - Lipid panel  4. Obesity (BMI 30.0-34.9) - CMP14+EGFR - CBC with Differential/Platelet  5. Hypothyroidism, unspecified type - - CMP14+EGFR - CBC with Differential/Platelet - TSH   Labs pending Health Maintenance reviewed Diet and exercise encouraged  Follow up plan: 6 months    Evelina Dun, FNP

## 2018-11-14 NOTE — Patient Instructions (Signed)
Hypothyroidism  Hypothyroidism is when the thyroid gland does not make enough of certain hormones (it is underactive). The thyroid gland is a small gland located in the lower front part of the neck, just in front of the windpipe (trachea). This gland makes hormones that help control how the body uses food for energy (metabolism) as well as how the heart and brain function. These hormones also play a role in keeping your bones strong. When the thyroid is underactive, it produces too little of the hormones thyroxine (T4) and triiodothyronine (T3). What are the causes? This condition may be caused by:  Hashimoto's disease. This is a disease in which the body's disease-fighting system (immune system) attacks the thyroid gland. This is the most common cause.  Viral infections.  Pregnancy.  Certain medicines.  Birth defects.  Past radiation treatments to the head or neck for cancer.  Past treatment with radioactive iodine.  Past exposure to radiation in the environment.  Past surgical removal of part or all of the thyroid.  Problems with a gland in the center of the brain (pituitary gland).  Lack of enough iodine in the diet. What increases the risk? You are more likely to develop this condition if:  You are female.  You have a family history of thyroid conditions.  You use a medicine called lithium.  You take medicines that affect the immune system (immunosuppressants). What are the signs or symptoms? Symptoms of this condition include:  Feeling as though you have no energy (lethargy).  Not being able to tolerate cold.  Weight gain that is not explained by a change in diet or exercise habits.  Lack of appetite.  Dry skin.  Coarse hair.  Menstrual irregularity.  Slowing of thought processes.  Constipation.  Sadness or depression. How is this diagnosed? This condition may be diagnosed based on:  Your symptoms, your medical history, and a physical exam.  Blood  tests. You may also have imaging tests, such as an ultrasound or MRI. How is this treated? This condition is treated with medicine that replaces the thyroid hormones that your body does not make. After you begin treatment, it may take several weeks for symptoms to go away. Follow these instructions at home:  Take over-the-counter and prescription medicines only as told by your health care provider.  If you start taking any new medicines, tell your health care provider.  Keep all follow-up visits as told by your health care provider. This is important. ? As your condition improves, your dosage of thyroid hormone medicine may change. ? You will need to have blood tests regularly so that your health care provider can monitor your condition. Contact a health care provider if:  Your symptoms do not get better with treatment.  You are taking thyroid replacement medicine and you: ? Sweat a lot. ? Have tremors. ? Feel anxious. ? Lose weight rapidly. ? Cannot tolerate heat. ? Have emotional swings. ? Have diarrhea. ? Feel weak. Get help right away if you have:  Chest pain.  An irregular heartbeat.  A rapid heartbeat.  Difficulty breathing. Summary  Hypothyroidism is when the thyroid gland does not make enough of certain hormones (it is underactive).  When the thyroid is underactive, it produces too little of the hormones thyroxine (T4) and triiodothyronine (T3).  The most common cause is Hashimoto's disease, a disease in which the body's disease-fighting system (immune system) attacks the thyroid gland. The condition can also be caused by viral infections, medicine, pregnancy, or past   radiation treatment to the head or neck.  Symptoms may include weight gain, dry skin, constipation, feeling as though you do not have energy, and not being able to tolerate cold.  This condition is treated with medicine to replace the thyroid hormones that your body does not make. This information  is not intended to replace advice given to you by your health care provider. Make sure you discuss any questions you have with your health care provider. Document Released: 10/15/2005 Document Revised: 09/25/2017 Document Reviewed: 09/25/2017 Elsevier Interactive Patient Education  2019 [employer]  

## 2018-11-15 LAB — CMP14+EGFR
ALT: 12 IU/L (ref 0–32)
AST: 15 IU/L (ref 0–40)
Albumin/Globulin Ratio: 1.6 (ref 1.2–2.2)
Albumin: 3.8 g/dL (ref 3.5–4.7)
Alkaline Phosphatase: 69 IU/L (ref 39–117)
BUN/Creatinine Ratio: 19 (ref 12–28)
BUN: 15 mg/dL (ref 8–27)
Bilirubin Total: 0.4 mg/dL (ref 0.0–1.2)
CO2: 24 mmol/L (ref 20–29)
Calcium: 9.3 mg/dL (ref 8.7–10.3)
Chloride: 102 mmol/L (ref 96–106)
Creatinine, Ser: 0.81 mg/dL (ref 0.57–1.00)
GFR calc Af Amer: 77 mL/min/{1.73_m2} (ref >59)
GFR calc non Af Amer: 67 mL/min/{1.73_m2} (ref >59)
GLOBULIN, TOTAL: 2.4 g/dL (ref 1.5–4.5)
Glucose: 91 mg/dL (ref 65–99)
Potassium: 4.8 mmol/L (ref 3.5–5.2)
SODIUM: 140 mmol/L (ref 134–144)
Total Protein: 6.2 g/dL (ref 6.0–8.5)

## 2018-11-15 LAB — CBC WITH DIFFERENTIAL/PLATELET
Basophils Absolute: 0 10*3/uL (ref 0.0–0.2)
Basos: 0 %
EOS (ABSOLUTE): 0.2 10*3/uL (ref 0.0–0.4)
Eos: 3 %
Hematocrit: 44.4 % (ref 34.0–46.6)
Hemoglobin: 15 g/dL (ref 11.1–15.9)
Immature Grans (Abs): 0.1 10*3/uL (ref 0.0–0.1)
Immature Granulocytes: 1 %
LYMPHS ABS: 2 10*3/uL (ref 0.7–3.1)
Lymphs: 21 %
MCH: 30.5 pg (ref 26.6–33.0)
MCHC: 33.8 g/dL (ref 31.5–35.7)
MCV: 90 fL (ref 79–97)
Monocytes Absolute: 0.8 10*3/uL (ref 0.1–0.9)
Monocytes: 8 %
Neutrophils Absolute: 6.3 10*3/uL (ref 1.4–7.0)
Neutrophils: 67 %
Platelets: 224 10*3/uL (ref 150–450)
RBC: 4.92 x10E6/uL (ref 3.77–5.28)
RDW: 12.1 % (ref 11.7–15.4)
WBC: 9.3 10*3/uL (ref 3.4–10.8)

## 2018-11-15 LAB — LIPID PANEL
Chol/HDL Ratio: 3.4 ratio (ref 0.0–4.4)
Cholesterol, Total: 122 mg/dL (ref 100–199)
HDL: 36 mg/dL — AB (ref >39)
LDL Calculated: 64 mg/dL (ref 0–99)
TRIGLYCERIDES: 112 mg/dL (ref 0–149)
VLDL Cholesterol Cal: 22 mg/dL (ref 5–40)

## 2018-11-15 LAB — TSH: TSH: 3.18 u[IU]/mL (ref 0.450–4.500)

## 2018-11-18 ENCOUNTER — Encounter: Payer: Self-pay | Admitting: Family

## 2018-11-18 ENCOUNTER — Ambulatory Visit (INDEPENDENT_AMBULATORY_CARE_PROVIDER_SITE_OTHER): Payer: Medicare HMO

## 2018-11-18 ENCOUNTER — Ambulatory Visit: Payer: Medicare HMO | Admitting: Family

## 2018-11-18 VITALS — BP 169/64 | HR 64 | Temp 97.0°F | Ht 59.5 in | Wt 174.8 lb

## 2018-11-18 DIAGNOSIS — R05 Cough: Secondary | ICD-10-CM

## 2018-11-18 DIAGNOSIS — R059 Cough, unspecified: Secondary | ICD-10-CM

## 2018-11-18 DIAGNOSIS — J209 Acute bronchitis, unspecified: Secondary | ICD-10-CM

## 2018-11-18 DIAGNOSIS — R0989 Other specified symptoms and signs involving the circulatory and respiratory systems: Secondary | ICD-10-CM | POA: Diagnosis not present

## 2018-11-18 DIAGNOSIS — J301 Allergic rhinitis due to pollen: Secondary | ICD-10-CM | POA: Diagnosis not present

## 2018-11-18 MED ORDER — PREDNISONE 10 MG (21) PO TBPK: ORAL_TABLET | ORAL | 0 refills | Status: DC

## 2018-11-18 MED ORDER — CETIRIZINE HCL 10 MG PO TABS: 10.0000 mg | ORAL_TABLET | Freq: Every day | ORAL | 11 refills | Status: DC

## 2018-11-18 MED ORDER — FLUTICASONE PROPIONATE 50 MCG/ACT NA SUSP: 2.0000 | Freq: Every day | NASAL | 6 refills | Status: DC

## 2018-11-18 NOTE — Progress Notes (Signed)
Subjective:    Patient ID: Carolyn Parsons, female    DOB: 11-12-1931, 85 y.o.   MRN: [id number]  Chief Complaint  Patient presents with  . Cough    Cough  This is a new problem. The current episode started 1 to 4 weeks ago. The problem has been waxing and waning. The problem occurs every few minutes. The cough is non-productive. Associated symptoms include chills, nasal congestion and rhinorrhea. Pertinent negatives include no ear congestion, ear pain, sore throat, shortness of breath or wheezing. The symptoms are aggravated by lying down. She has tried rest and OTC cough suppressant for the symptoms. The treatment provided mild relief. There is no history of asthma.      Review of Systems  Constitutional: Positive for chills.  HENT: Positive for rhinorrhea. Negative for ear pain and sore throat.   Respiratory: Positive for cough. Negative for shortness of breath and wheezing.   All other systems reviewed and are negative.      Objective:   Physical Exam Vitals signs reviewed.  Constitutional:      General: She is not in acute distress.    Appearance: She is well-developed.  HENT:     Head: Normocephalic and atraumatic.     Right Ear: Tympanic membrane normal.     Left Ear: Tympanic membrane normal.     Nose: Mucosal edema present.     Mouth/Throat:     Pharynx: Posterior oropharyngeal erythema present.  Eyes:     Pupils: Pupils are equal, round, and reactive to light.  Neck:     Musculoskeletal: Normal range of motion and neck supple.     Thyroid: No thyromegaly.  Cardiovascular:     Rate and Rhythm: Normal rate and regular rhythm.     Heart sounds: Normal heart sounds. No murmur.  Pulmonary:     Effort: Pulmonary effort is normal. No respiratory distress.     Breath sounds: Normal breath sounds. No wheezing.  Abdominal:     General: Bowel sounds are normal. There is no distension.     Palpations: Abdomen is soft.     Tenderness: There is no abdominal tenderness.    Musculoskeletal: Normal range of motion.        General: No tenderness.  Skin:    General: Skin is warm and dry.  Neurological:     Mental Status: She is alert and oriented to person, place, and time.     Cranial Nerves: No cranial nerve deficit.     Deep Tendon Reflexes: Reflexes are normal and symmetric.  Psychiatric:        Behavior: Behavior normal.        Thought Content: Thought content normal.        Judgment: Judgment normal.       BP (!) 169/64   Pulse 64   Temp (!) 97 F (36.1 C) (Oral)   Ht 4' 11.5" (1.511 m)   Wt 174 lb 12.8 oz (79.3 kg)   BMI 34.71 kg/m      Assessment & Plan:  Carolyn Parsons comes in today with chief complaint of Cough   Diagnosis and orders addressed:  1. Cough We will change Claritin to Zyrtec  Restart flonase - cetirizine (ZYRTEC) 10 MG tablet; Take 1 tablet (10 mg total) by mouth daily.  Dispense: 30 tablet; Refill: 11 - DG Chest 2 View; Future  2. Allergic rhinitis due to pollen, unspecified seasonality - cetirizine (ZYRTEC) 10 MG tablet; Take 1 tablet (10 mg total)  by mouth daily.  Dispense: 30 tablet; Refill: 11 - DG Chest 2 View; Future - fluticasone (FLONASE) 50 MCG/ACT nasal spray; Place 2 sprays into both nostrils daily.  Dispense: 16 g; Refill: 6  3. Acute bronchitis, unspecified organism - Take meds as prescribed - Use a cool mist humidifier  -Use saline nose sprays frequently -Force fluids -For any cough or congestion  Use plain Mucinex- regular strength or max strength is fine -For fever or aces or pains- take tylenol or ibuprofen. -Throat lozenges if helps - predniSONE (STERAPRED UNI-PAK 21 TAB) 10 MG (21) TBPK tablet; Use as directed  Dispense: 21 tablet; Refill: 0  Carolyn Rodneyhristy Katalyn Matin, FNP

## 2018-11-18 NOTE — Patient Instructions (Signed)

## 2018-11-19 IMAGING — MR MR LUMBAR SPINE W/O CM
5 series · 48 of 48 positions shown · non-contrast
Comparison: Plain film 05/19/2018.

CLINICAL DATA: Low back and LEFT leg pain.  No specific injury.

EXAM:
MRI LUMBAR SPINE WITHOUT CONTRAST
TECHNIQUE: Multiplanar, multisequence MR imaging of the lumbar spine was
performed. No intravenous contrast was administered.

[Series 3: T2 · sagittal · 4.0mm · 0.88mm/px · 4 of 12 slices shown (1 of 2)]
[im 1/12]
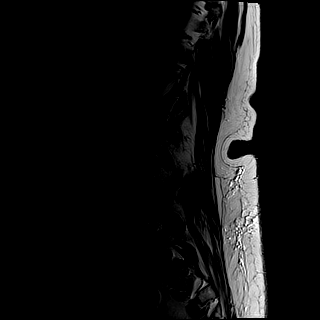
[im 4/12]
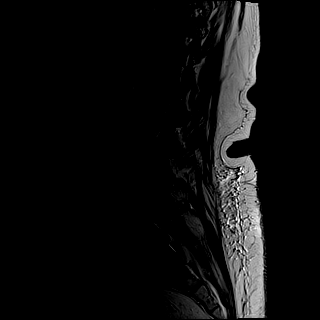
[im 8/12]
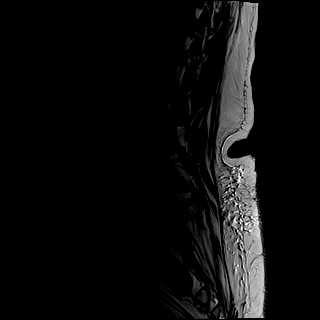
[im 12/12]
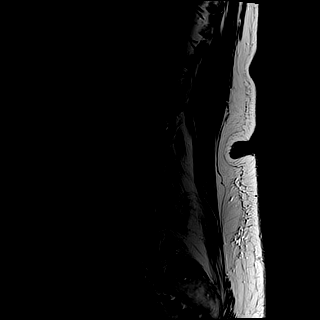

[Series 4: tirm sag · sagittal · 4.0mm · 0.88mm/px · 5 of 12 slices shown]
[im 1/12]
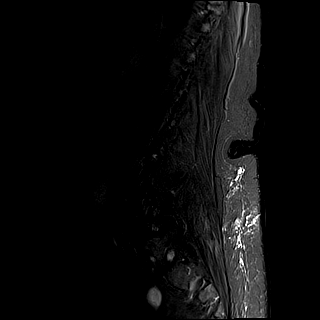
[im 3/12]
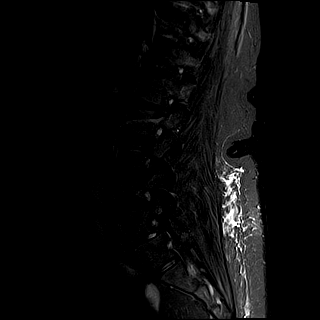
[im 6/12]
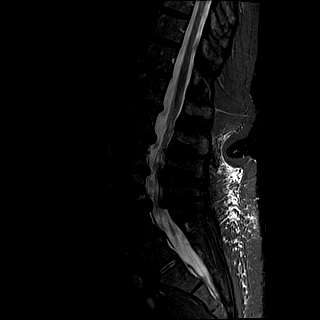
[im 9/12]
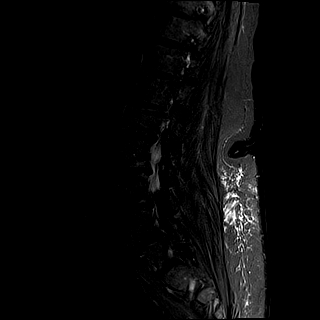
[im 12/12]
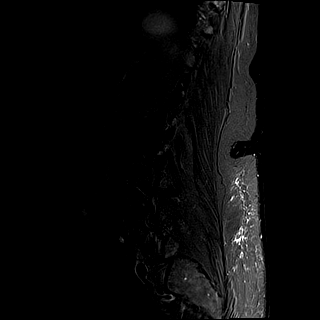

[Series 5: T1 · sagittal · 4.0mm · 0.88mm/px · 5 of 12 slices shown (1 of 2)]
[im 1/12]
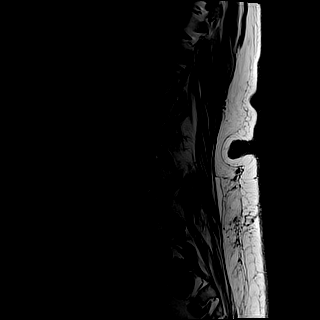
[im 3/12]
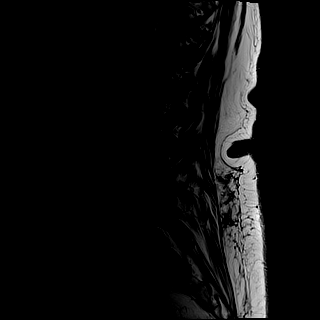
[im 6/12]
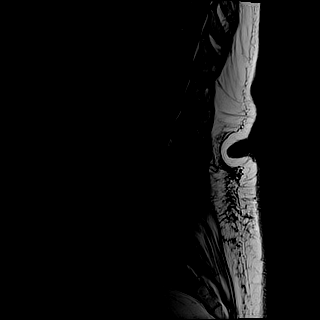
[im 9/12]
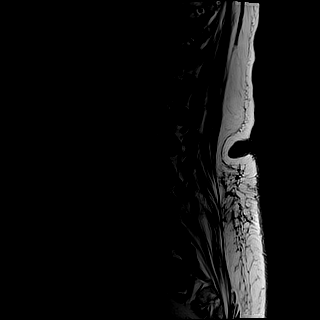
[im 12/12]
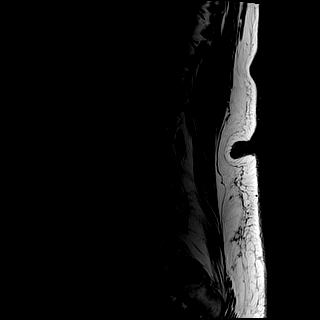

[Series 10: T1 · axial · 4.0mm · 0.70mm/px · z∈[-55,+165]mm · 17 of 40 slices shown (2 of 2)]
[im 1/40]
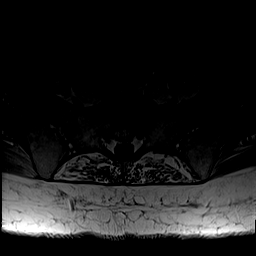
[im 3/40]
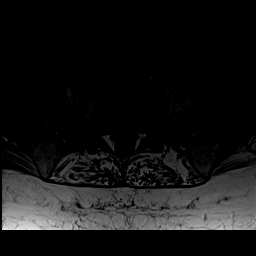
[im 5/40]
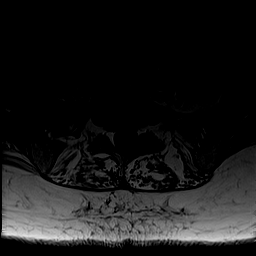
[im 8/40]
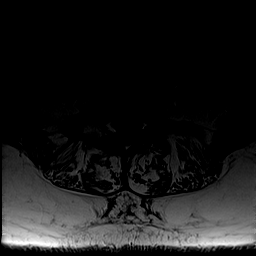
[im 10/40]
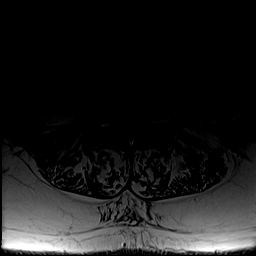
[im 13/40]
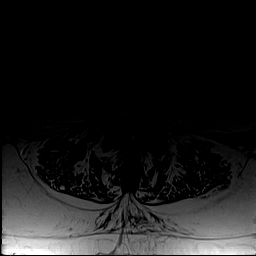
[im 15/40]
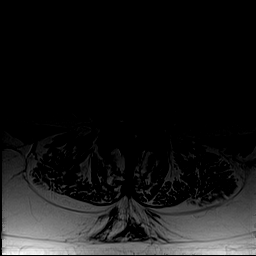
[im 18/40]
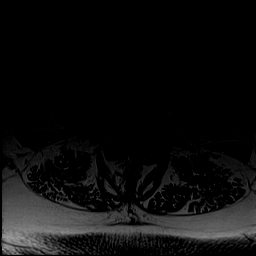
[im 20/40]
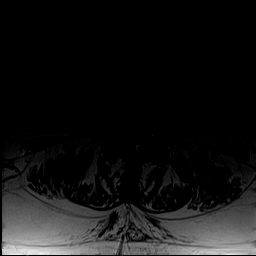
[im 22/40]
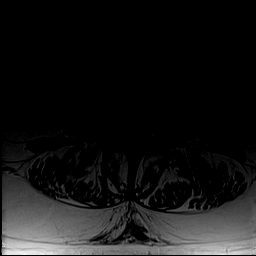
[im 25/40]
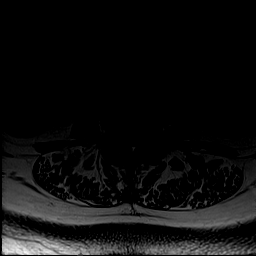
[im 27/40]
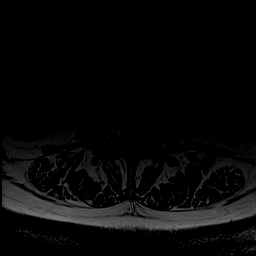
[im 30/40]
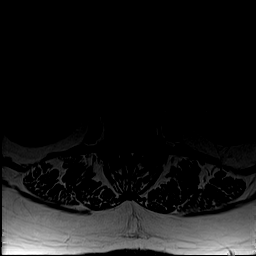
[im 32/40]
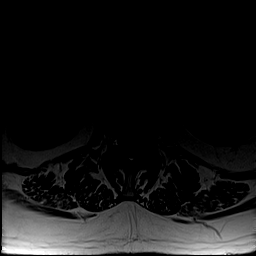
[im 35/40]
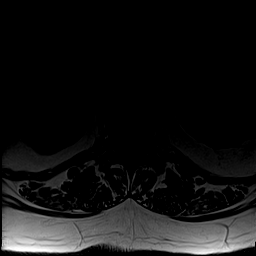
[im 37/40]
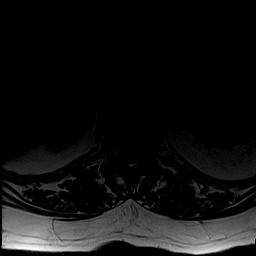
[im 40/40]
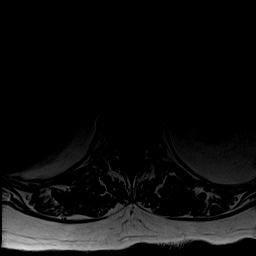

[Series 15: T2 · axial · 4.0mm · 0.78mm/px · z∈[-59,+166]mm · 17 of 40 slices shown (2 of 2)]
[im 1/40]
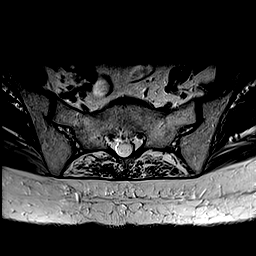
[im 3/40]
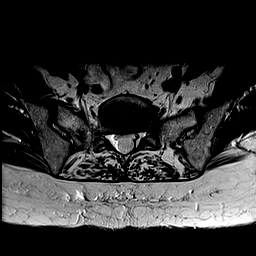
[im 5/40]
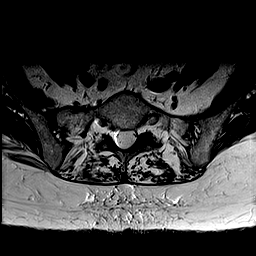
[im 8/40]
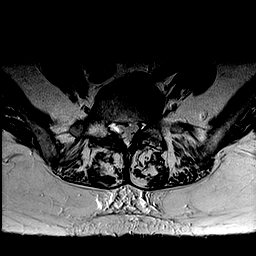
[im 10/40]
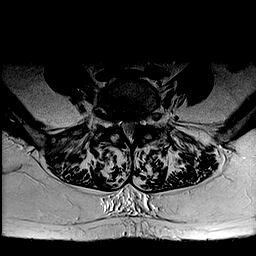
[im 13/40]
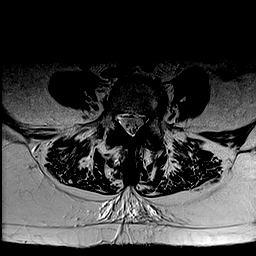
[im 15/40]
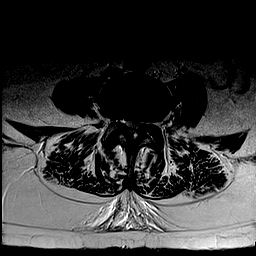
[im 18/40]
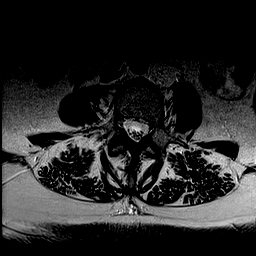
[im 20/40]
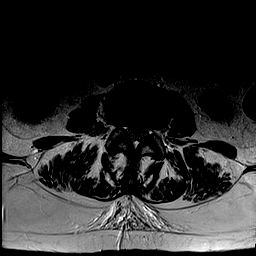
[im 22/40]
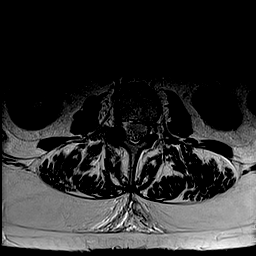
[im 25/40]
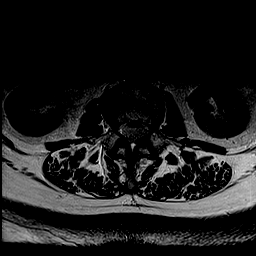
[im 27/40]
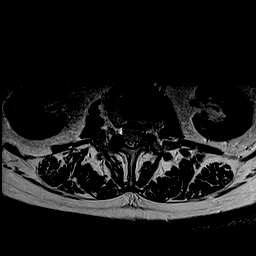
[im 30/40]
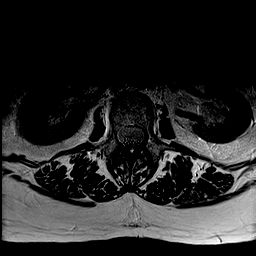
[im 32/40]
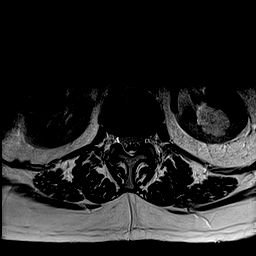
[im 35/40]
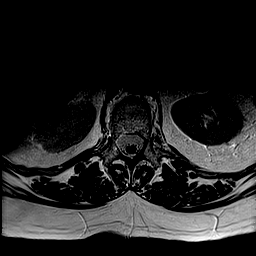
[im 37/40]
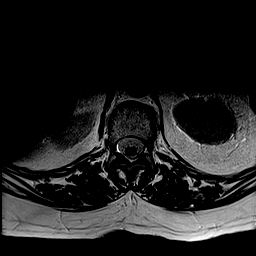
[im 40/40]
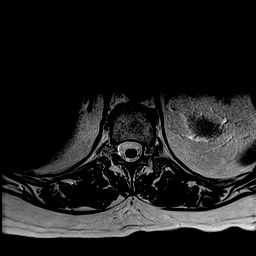

[48 of 48 positions shown; findings below may reference images not displayed]

FINDINGS: Segmentation:  L5 is sacralized.  L5-S1 is hypoplastic.

Alignment:  Degenerative anterolisthesis L4 on L5 measures 5 mm.

Vertebrae:  No worrisome osseous lesion.  Endplate reactive changes.

Conus medullaris and cauda equina: Conus extends to the L1 level.
Conus and cauda equina appear normal.

Paraspinal and other soft tissues: Negative.

Disc levels:

L1-L2: Disc space narrowing. Central disc extrusion. Mild stenosis.
Possible LEFT L2 neural impingement. Disc material does extend into
the foramen, but clear-cut L1 neural impingement not established.

L2-L3: Disc space narrowing. Central protrusion. Posterior element
hypertrophy. RIGHT greater than LEFT L3 neural impingement.

L3-L4: Disc space narrowing. Central protrusion. Posterior element
hypertrophy. Moderate stenosis. BILATERAL L4 neural impingement.
Disc material extends into both foramina, and there also and
extraforaminal disc herniation on the RIGHT. RIGHT L3 neural
impingement is likely.

L4-L5: 5 mm anterolisthesis. Uncovering of the disc with mild bulge.
Posterior element hypertrophy. Mild to moderate stenosis. No
definite subarticular zone or foraminal zone narrowing.

L5-S1:  Rudimentary disc space.  No impingement.
IMPRESSION: Transitional anatomy.  L5-S1 is fused.

Mild to moderate stenosis at L4-5 due to 5 mm slip, posterior
element hypertrophy, and uncovering of the disc with mild bulge. No
definite subarticular zone or foraminal zone narrowing. Consider
lateral standing flexion extension lumbar radiographs to evaluate
for dynamic instability.

Moderate stenosis at L3-4 related to disc space narrowing, central
protrusion, and posterior element hypertrophy. BILATERAL L4 neural
impingement in the canal, with additional RIGHT L3 neural
impingement likely due to extraforaminal disc herniation.

Central protrusion at L2-3, RIGHT greater than LEFT L3 neural
impingement.

Central extrusion L1-2, possible LEFT L2 neural impingement.

## 2018-11-20 ENCOUNTER — Other Ambulatory Visit: Payer: Self-pay | Admitting: Family

## 2018-11-20 DIAGNOSIS — I700 Atherosclerosis of aorta: Secondary | ICD-10-CM | POA: Insufficient documentation

## 2018-11-26 DIAGNOSIS — L722 Steatocystoma multiplex: Secondary | ICD-10-CM | POA: Diagnosis not present

## 2018-11-26 DIAGNOSIS — B078 Other viral warts: Secondary | ICD-10-CM | POA: Diagnosis not present

## 2018-12-01 ENCOUNTER — Encounter: Payer: Self-pay | Admitting: *Deleted

## 2018-12-01 ENCOUNTER — Ambulatory Visit (INDEPENDENT_AMBULATORY_CARE_PROVIDER_SITE_OTHER): Payer: Medicare HMO | Admitting: *Deleted

## 2018-12-01 VITALS — BP 137/57 | HR 60 | Ht 59.5 in | Wt 174.0 lb

## 2018-12-01 DIAGNOSIS — Z0000 Encounter for general adult medical examination without abnormal findings: Secondary | ICD-10-CM | POA: Diagnosis not present

## 2018-12-01 NOTE — Progress Notes (Addendum)
Subjective:   Carolyn Parsons is a 85 y.o. female who presents for a Initial Medicare Annual Wellness Visit.  Carolyn Parsons is accompanied today by her daughter Carolyn Parsons.  She lives with Carolyn Parsons and Molly's husband.  She states she moved here 6 months ago from [place].  She has been widowed twice, her 2nd husband passed away 7 years ago.  She worked as a Surveyor, mining for 30 years.  [name] has 4 children, 11 grandchildren, and 7 great grandchildren.  She enjoys doing word search puzzles and watching television.  Patient Care Team: Carolyn Spencer, FNP as PCP - General (Family Medicine) Carolyn Hitch, MD as Consulting Physician (Orthopedic Surgery)  Hospitalizations, surgeries, and ER visits in previous 12 months No hospitalizations, ER visits, or surgeries this past year.   Review of Systems    Patient reports that her overall health is unchanged compared to last year.       All other systems negative       Current Medications (verified) Outpatient Encounter Medications as of 12/01/2018  Medication Sig  . amLODipine (NORVASC) 5 MG tablet Take 1 tablet (5 mg total) by mouth daily.  . brimonidine-timolol (COMBIGAN) 0.2-0.5 % ophthalmic solution Place 1 drop into both eyes every 12 (twelve) hours.  . cetirizine (ZYRTEC) 10 MG tablet Take 1 tablet (10 mg total) by mouth daily.  . fluticasone (FLONASE) 50 MCG/ACT nasal spray Place 2 sprays into both nostrils daily.  Marland Kitchen levothyroxine (SYNTHROID, LEVOTHROID) 25 MCG tablet TAKE 1 TABLET BY MOUTH  DAILY BEFORE BREAKFAST  . metoprolol succinate (TOPROL-XL) 25 MG 24 hr tablet Take 1 tablet (25 mg total) by mouth daily.  . rosuvastatin (CRESTOR) 20 MG tablet Take 1 tablet (20 mg total) by mouth daily.  . [DISCONTINUED] predniSONE (STERAPRED UNI-PAK 21 TAB) 10 MG (21) TBPK tablet Use as directed   No facility-administered encounter medications on file as of 12/01/2018.     Allergies (verified) Amoxicillin and Penicillins    History: Past Medical History:  Diagnosis Date  . Cataract   . Hyperlipidemia   . Hypertension    Past Surgical History:  Procedure Laterality Date  . APPENDECTOMY    . BLADDER REPAIR    . CATARACT EXTRACTION, BILATERAL    . CHOLECYSTECTOMY     Family History  Problem Relation Age of Onset  . Cancer Mother   . Cancer Father        Brain   . Heart disease Sister   . Leukemia Sister   . Cancer Brother        Prostate  . Hyperlipidemia Daughter   . Hypertension Daughter   . Heart disease Son   . Dementia Sister   . Heart disease Son   . Hyperlipidemia Son   . Hypertension Son    Social History   Socioeconomic History  . Marital status: Widowed    Spouse name: Not on file  . Number of children: 4  . Years of education: Not on file  . Highest education level: 8th grade  Occupational History  . Occupation: retired    Comment: school bus driver   Social Needs  . Financial resource strain: Not hard at all  . Food insecurity:    Worry: Never true    Inability: Never true  . Transportation needs:    Medical: No    Non-medical: No  Tobacco Use  . Smoking status: Former Smoker    Packs/day: 0.25    Years: 20.00  Pack years: 5.00    Last attempt to quit: 10/29/1993    Years since quitting: 25.1  . Smokeless tobacco: Never Used  Substance and Sexual Activity  . Alcohol use: Never    Frequency: Never  . Drug use: Never  . Sexual activity: Not on file  Lifestyle  . Physical activity:    Days per week: 0 days    Minutes per session: 0 min  . Stress: Not on file  Relationships  . Social connections:    Talks on phone: Once a week    Gets together: More than three times a week    Attends religious service: Never    Active member of club or organization: No    Attends meetings of clubs or organizations: Never    Relationship status: Widowed  Other Topics Concern  . Not on file  Social History Narrative  . Not on file     Clinical Intake:     Pain  Score: 0-No pain                  Activities of Daily Living In your present state of health, do you have any difficulty performing the following activities: 12/01/2018  Hearing? Y  Comment bilateral hearing aids   Vision? N  Difficulty concentrating or making decisions? Y  Comment Occasional trouble remembering  Walking or climbing stairs? N  Dressing or bathing? N  Doing errands, shopping? N  Comment Daughter provides Wellsite geologist and eating ? N  Using the Toilet? N  In the past six months, have you accidently leaked urine? Y  Comment Urinary frequency  Do you have problems with loss of bowel control? N  Managing your Medications? N  Managing your Finances? Y  Comment Daughter helps   Housekeeping or managing your Housekeeping? N  Comment Lives with daughter and son-in-law.  Does not have to much housekeeping   Some recent data might be hidden     Exercise Current Exercise Habits: The patient does not participate in regular exercise at present  Diet-  Patient states she usually eats 2 small meals and one larger meal per day and ice cream for snack.  Recommended diet of mostly vegetables, fruits, lean proteins, and whole grains.  Health cooking methods discussed.  Patient states she has access to all the food she needs.  Depression Screen PHQ 2/9 Scores 12/01/2018 11/18/2018 11/14/2018 05/16/2018  PHQ - 2 Score 0 0 0 0     Fall Risk Fall Risk  12/01/2018 11/18/2018 11/14/2018 05/16/2018  Falls in the past year? 0 0 0 No     Objective:    Today's Vitals   12/01/18 0834  BP: (!) 137/57  Pulse: 60  Weight: 174 lb (78.9 kg)  Height: 4' 11.5" (1.511 m)  PainSc: 0-No pain   Body mass index is 34.56 kg/m.  Advanced Directives 12/01/2018  Does Patient Have a Medical Advance Directive? No  Would patient like information on creating a medical advance directive? Yes (MAU/Ambulatory/Procedural Areas - Information given)    Hearing/Vision  No hearing  or vision deficits noted during visit. Patient wearing bilateral hearing aids  Has not seen eye doctor since moving here from [place] 6 months ago.  She was seen right before she moved.  Patient states she has had bilateral cataracts removed and uses glasses only for reading.  She states she also has glaucoma.  She uses Combigan eye drops to control.  Her daughter states she will  be going to [facility] in [place] for her next appointment.  Cognitive Function: MMSE - Mini Mental State Exam 12/01/2018  Orientation to time 4  Orientation to Place 4  Registration 3  Attention/ Calculation 5  Recall 3  Language- name 2 objects 2  Language- repeat 1  Language- follow 3 step command 3  Language- read & follow direction 1  Write a sentence 1  Copy design 1  Total score 28           Immunizations and Health Maintenance Immunization History  Administered Date(s) Administered  . Influenza, High Dose Seasonal PF 08/07/2018   Health Maintenance Due  Topic Date Due  . TETANUS/TDAP  07/30/1951  . PNA vac Low Risk Adult (1 of 2 - PCV13) 07/31/1999   Patient states she had tdap and both pneumonia vaccines in recent years.  Called her previous office and requested immunization records.    Health Maintenance  Topic Date Due  . TETANUS/TDAP  07/30/1951  . PNA vac Low Risk Adult (1 of 2 - PCV13) 07/31/1999  . INFLUENZA VACCINE  Completed  . DEXA SCAN  Discontinued        Assessment:   This is a routine wellness examination for Carolyn Parsons.    Plan:    Goals   None    Patient did not wish to set a goal.   Health Maintenance & Additional Screening Recommendations  Advanced directives: has NO advanced directive  - add't info requested. Referral to SW: no  Lung: Low Dose CT Chest recommended if Age 46-80 years, 30 pack-year currently smoking OR have quit w/in 15years. Patient does not qualify. Hepatitis C Screening recommended: not applicable HIV Screening recommended:  not applicable  Today's Orders No orders of the defined types were placed in this encounter.   Keep f/u with Carolyn Spencer, FNP and any other specialty appointments you may have Continue current medications Move carefully to avoid falls. Use assistive devices like a cane or walker if needed. Aim for at least 150 minutes of moderate activity a week. This can be done with chair exercises if necessary. Read or work on puzzles daily Stay connected with friends and family  I have personally reviewed and noted the following in the patient's chart:   . Medical and social history . Use of alcohol, tobacco or illicit drugs  . Current medications and supplements . Functional ability and status . Nutritional status . Physical activity . Advanced directives . List of other physicians . Hospitalizations, surgeries, and ER visits in previous 12 months . Vitals . Screenings to include cognitive, depression, and falls . Referrals and appointments  In addition, I have reviewed and discussed with patient certain preventive protocols, quality metrics, and best practice recommendations. A written personalized care plan for preventive services as well as general preventive health recommendations were provided to patient.     Lilia Argue, RN  12/01/2018   I have reviewed and agree with the above AWV documentation.   Jannifer Rodney, FNP

## 2018-12-01 NOTE — Patient Instructions (Addendum)
Please review the information given on Advance Directives.  If you complete the paperwork, please bring a copy to our office to be filed in your medical record.   Continue to move carefully to avoid falls.   Please follow up with Evelina Dun, FNP as scheduled.  Please work on doing chair exercises a few times per week.   Thank you for coming in for your Annual Wellness Visit today!   Preventive Care 83 Years and Older, Female Preventive care refers to lifestyle choices and visits with your health care provider that can promote health and wellness. What does preventive care include?  A yearly physical exam. This is also called an annual well check.  Dental exams once or twice a year.  Routine eye exams. Ask your health care provider how often you should have your eyes checked.  Personal lifestyle choices, including: ? Daily care of your teeth and gums. ? Regular physical activity. ? Eating a healthy diet. ? Avoiding tobacco and drug use. ? Limiting alcohol use. ? Practicing safe sex. ? Taking low-dose aspirin every day. ? Taking vitamin and mineral supplements as recommended by your health care provider. What happens during an annual well check? The services and screenings done by your health care provider during your annual well check will depend on your age, overall health, lifestyle risk factors, and family history of disease. Counseling Your health care provider may ask you questions about your:  Alcohol use.  Tobacco use.  Drug use.  Emotional well-being.  Home and relationship well-being.  Sexual activity.  Eating habits.  History of falls.  Memory and ability to understand (cognition).  Work and work Statistician.  Reproductive health.  Screening You may have the following tests or measurements:  Height, weight, and BMI.  Blood pressure.  Lipid and cholesterol levels. These may be checked every 5 years, or more frequently if you are over 68 years  old.  Skin check.  Lung cancer screening. You may have this screening every year starting at age 48 if you have a 30-pack-year history of smoking and currently smoke or have quit within the past 15 years.  Colorectal cancer screening. All adults should have this screening starting at age 78 and continuing until age 64. You will have tests every 1-10 years, depending on your results and the type of screening test. People at increased risk should start screening at an earlier age. Screening tests may include: ? Guaiac-based fecal occult blood testing. ? Fecal immunochemical test (FIT). ? Stool DNA test. ? Virtual colonoscopy. ? Sigmoidoscopy. During this test, a flexible tube with a tiny camera (sigmoidoscope) is used to examine your rectum and lower colon. The sigmoidoscope is inserted through your anus into your rectum and lower colon. ? Colonoscopy. During this test, a long, thin, flexible tube with a tiny camera (colonoscope) is used to examine your entire colon and rectum.  Hepatitis C blood test.  Hepatitis B blood test.  Sexually transmitted disease (STD) testing.  Diabetes screening. This is done by checking your blood sugar (glucose) after you have not eaten for a while (fasting). You may have this done every 1-3 years.  Bone density scan. This is done to screen for osteoporosis. You may have this done starting at age 51.  Mammogram. This may be done every 1-2 years. Talk to your health care provider about how often you should have regular mammograms. Talk with your health care provider about your test results, treatment options, and if necessary, the need  for more tests. Vaccines Your health care provider may recommend certain vaccines, such as:  Influenza vaccine. This is recommended every year.  Tetanus, diphtheria, and acellular pertussis (Tdap, Td) vaccine. You may need a Td booster every 10 years.  Varicella vaccine. You may need this if you have not been  vaccinated.  Zoster vaccine. You may need this after age 32.  Measles, mumps, and rubella (MMR) vaccine. You may need at least one dose of MMR if you were born in 1957 or later. You may also need a second dose.  Pneumococcal 13-valent conjugate (PCV13) vaccine. One dose is recommended after age 41.  Pneumococcal polysaccharide (PPSV23) vaccine. One dose is recommended after age 70.  Meningococcal vaccine. You may need this if you have certain conditions.  Hepatitis A vaccine. You may need this if you have certain conditions or if you travel or work in places where you may be exposed to hepatitis A.  Hepatitis B vaccine. You may need this if you have certain conditions or if you travel or work in places where you may be exposed to hepatitis B.  Haemophilus influenzae type b (Hib) vaccine. You may need this if you have certain conditions. Talk to your health care provider about which screenings and vaccines you need and how often you need them. This information is not intended to replace advice given to you by your health care provider. Make sure you discuss any questions you have with your health care provider. Document Released: 11/11/2015 Document Revised: 12/05/2017 Document Reviewed: 08/16/2015 Elsevier Interactive Patient Education  2019 [employer].

## 2019-01-01 DIAGNOSIS — H524 Presbyopia: Secondary | ICD-10-CM | POA: Diagnosis not present

## 2019-01-01 DIAGNOSIS — H401132 Primary open-angle glaucoma, bilateral, moderate stage: Secondary | ICD-10-CM | POA: Diagnosis not present

## 2019-05-14 DIAGNOSIS — H401131 Primary open-angle glaucoma, bilateral, mild stage: Secondary | ICD-10-CM | POA: Diagnosis not present

## 2019-05-15 ENCOUNTER — Other Ambulatory Visit: Payer: Self-pay

## 2019-05-18 ENCOUNTER — Other Ambulatory Visit: Payer: Self-pay

## 2019-05-18 ENCOUNTER — Ambulatory Visit: Payer: Medicare HMO | Admitting: Family

## 2019-05-18 ENCOUNTER — Telehealth: Payer: Self-pay | Admitting: Family

## 2019-05-18 ENCOUNTER — Encounter: Payer: Self-pay | Admitting: Family

## 2019-05-18 VITALS — BP 148/63 | HR 57 | Temp 97.8°F | Ht 59.5 in | Wt 180.0 lb

## 2019-05-18 DIAGNOSIS — R059 Cough, unspecified: Secondary | ICD-10-CM

## 2019-05-18 DIAGNOSIS — J301 Allergic rhinitis due to pollen: Secondary | ICD-10-CM

## 2019-05-18 DIAGNOSIS — R05 Cough: Secondary | ICD-10-CM | POA: Diagnosis not present

## 2019-05-18 DIAGNOSIS — I700 Atherosclerosis of aorta: Secondary | ICD-10-CM

## 2019-05-18 DIAGNOSIS — K219 Gastro-esophageal reflux disease without esophagitis: Secondary | ICD-10-CM | POA: Diagnosis not present

## 2019-05-18 DIAGNOSIS — E039 Hypothyroidism, unspecified: Secondary | ICD-10-CM

## 2019-05-18 DIAGNOSIS — E785 Hyperlipidemia, unspecified: Secondary | ICD-10-CM

## 2019-05-18 DIAGNOSIS — I10 Essential (primary) hypertension: Secondary | ICD-10-CM | POA: Diagnosis not present

## 2019-05-18 DIAGNOSIS — E669 Obesity, unspecified: Secondary | ICD-10-CM

## 2019-05-18 MED ORDER — CETIRIZINE HCL 10 MG PO TABS: 10.0000 mg | ORAL_TABLET | Freq: Every day | ORAL | 11 refills | Status: DC

## 2019-05-18 MED ORDER — LEVOTHYROXINE SODIUM 25 MCG PO TABS: ORAL_TABLET | ORAL | 3 refills | Status: DC

## 2019-05-18 MED ORDER — METOPROLOL SUCCINATE ER 25 MG PO TB24: 25.0000 mg | ORAL_TABLET | Freq: Every day | ORAL | 3 refills | Status: DC

## 2019-05-18 MED ORDER — ROSUVASTATIN CALCIUM 20 MG PO TABS: 20.0000 mg | ORAL_TABLET | Freq: Every day | ORAL | 3 refills | Status: DC

## 2019-05-18 MED ORDER — FLUTICASONE PROPIONATE 50 MCG/ACT NA SUSP: 2.0000 | Freq: Every day | NASAL | 6 refills | Status: DC

## 2019-05-18 MED ORDER — CETIRIZINE HCL 10 MG PO TABS: 10.0000 mg | ORAL_TABLET | Freq: Every day | ORAL | 4 refills | Status: DC

## 2019-05-18 MED ORDER — AMLODIPINE BESYLATE 5 MG PO TABS: 5.0000 mg | ORAL_TABLET | Freq: Every day | ORAL | 3 refills | Status: DC

## 2019-05-18 NOTE — Patient Instructions (Signed)
Health Maintenance After Age 83 After age 83, you are at a higher risk for certain long-term diseases and infections as well as injuries from falls. Falls are a major cause of broken bones and head injuries in people who are older than age 83. Getting regular preventive care can help to keep you healthy and well. Preventive care includes getting regular testing and making lifestyle changes as recommended by your health care provider. Talk with your health care provider about:  Which screenings and tests you should have. A screening is a test that checks for a disease when you have no symptoms.  A diet and exercise plan that is right for you. What should I know about screenings and tests to prevent falls? Screening and testing are the best ways to find a health problem early. Early diagnosis and treatment give you the best chance of managing medical conditions that are common after age 83. Certain conditions and lifestyle choices may make you more likely to have a fall. Your health care provider may recommend:  Regular vision checks. Poor vision and conditions such as cataracts can make you more likely to have a fall. If you wear glasses, make sure to get your prescription updated if your vision changes.  Medicine review. Work with your health care provider to regularly review all of the medicines you are taking, including over-the-counter medicines. Ask your health care provider about any side effects that may make you more likely to have a fall. Tell your health care provider if any medicines that you take make you feel dizzy or sleepy.  Osteoporosis screening. Osteoporosis is a condition that causes the bones to get weaker. This can make the bones weak and cause them to break more easily.  Blood pressure screening. Blood pressure changes and medicines to control blood pressure can make you feel dizzy.  Strength and balance checks. Your health care provider may recommend certain tests to check your  strength and balance while standing, walking, or changing positions.  Foot health exam. Foot pain and numbness, as well as not wearing proper footwear, can make you more likely to have a fall.  Depression screening. You may be more likely to have a fall if you have a fear of falling, feel emotionally low, or feel unable to do activities that you used to do.  Alcohol use screening. Using too much alcohol can affect your balance and may make you more likely to have a fall. What actions can I take to lower my risk of falls? General instructions  Talk with your health care provider about your risks for falling. Tell your health care provider if: ? You fall. Be sure to tell your health care provider about all falls, even ones that seem minor. ? You feel dizzy, sleepy, or off-balance.  Take over-the-counter and prescription medicines only as told by your health care provider. These include any supplements.  Eat a healthy diet and maintain a healthy weight. A healthy diet includes low-fat dairy products, low-fat (lean) meats, and fiber from whole grains, beans, and lots of fruits and vegetables. Home safety  Remove any tripping hazards, such as rugs, cords, and clutter.  Install safety equipment such as grab bars in bathrooms and safety rails on stairs.  Keep rooms and walkways well-lit. Activity   Follow a regular exercise program to stay fit. This will help you maintain your balance. Ask your health care provider what types of exercise are appropriate for you.  If you need a cane or   walker, use it as recommended by your health care provider.  Wear supportive shoes that have nonskid soles. Lifestyle  Do not drink alcohol if your health care provider tells you not to drink.  If you drink alcohol, limit how much you have: ? 0-1 drink a day for women. ? 0-2 drinks a day for men.  Be aware of how much alcohol is in your drink. In the [place] one drink equals one typical bottle of beer (12  oz), one-half glass of wine (5 oz), or one shot of hard liquor (1 oz).  Do not use any products that contain nicotine or tobacco, such as cigarettes and e-cigarettes. If you need help quitting, ask your health care provider. Summary  Having a healthy lifestyle and getting preventive care can help to protect your health and wellness after age 83.  Screening and testing are the best way to find a health problem early and help you avoid having a fall. Early diagnosis and treatment give you the best chance for managing medical conditions that are more common for people who are older than age 83.  Falls are a major cause of broken bones and head injuries in people who are older than age 83. Take precautions to prevent a fall at home.  Work with your health care provider to learn what changes you can make to improve your health and wellness and to prevent falls. This information is not intended to replace advice given to you by your health care provider. Make sure you discuss any questions you have with your health care provider. Document Released: 08/28/2017 Document Revised: 02/05/2019 Document Reviewed: 08/28/2017 Elsevier Patient Education  2020 Else[employer] Inc[employer]  

## 2019-05-18 NOTE — Telephone Encounter (Signed)
Requesting zyrtec sent to [facility] - rx re sent.

## 2019-05-18 NOTE — Progress Notes (Signed)
Subjective:    Patient ID: Carolyn Parsons, female    DOB: 29-Apr-1932, 85 y.o.   MRN: [id number]  Chief Complaint  Patient presents with  . Medical Management of Chronic Issues   Pt presents to the office today for chronic follow up.  Hypertension This is a chronic problem. The current episode started more than 1 year ago. The problem has been waxing and waning since onset. The problem is controlled. Pertinent negatives include no headaches, malaise/fatigue, peripheral edema or shortness of breath. Risk factors for coronary artery disease include dyslipidemia, obesity and sedentary lifestyle. The current treatment provides moderate improvement. There is no history of kidney disease, CAD/MI or heart failure. Identifiable causes of hypertension include a thyroid problem.  Gastroesophageal Reflux Carolyn Parsons reports no belching, no heartburn or no hoarse voice. This is a chronic problem. The current episode started more than 1 year ago. The problem occurs occasionally. Pertinent negatives include no fatigue. Risk factors include obesity. Carolyn Parsons has tried an antacid for the symptoms. The treatment provided moderate relief.  Thyroid Problem Presents for follow-up visit. Patient reports no depressed mood, dry skin, fatigue or hoarse voice. The symptoms have been stable. Her past medical history is significant for hyperlipidemia. There is no history of heart failure.  Hyperlipidemia This is a chronic problem. The current episode started more than 1 year ago. Recent lipid tests were reviewed and are normal. Pertinent negatives include no shortness of breath. Current antihyperlipidemic treatment includes statins. The current treatment provides moderate improvement of lipids. Risk factors for coronary artery disease include dyslipidemia, hypertension, a sedentary lifestyle and post-menopausal.      Review of Systems  Constitutional: Negative for fatigue and malaise/fatigue.  HENT: Negative for hoarse voice.    Respiratory: Negative for shortness of breath.   Gastrointestinal: Negative for heartburn.  Neurological: Negative for headaches.  All other systems reviewed and are negative.      Objective:   Physical Exam Vitals signs reviewed.  Constitutional:      General: Carolyn Parsons is not in acute distress.    Appearance: Carolyn Parsons is well-developed.  HENT:     Head: Normocephalic and atraumatic.     Right Ear: Tympanic membrane normal.     Left Ear: Tympanic membrane normal.  Eyes:     Pupils: Pupils are equal, round, and reactive to light.  Neck:     Musculoskeletal: Normal range of motion and neck supple.     Thyroid: No thyromegaly.  Cardiovascular:     Rate and Rhythm: Normal rate and regular rhythm.     Heart sounds: Normal heart sounds. No murmur.  Pulmonary:     Effort: Pulmonary effort is normal. No respiratory distress.     Breath sounds: Normal breath sounds. No wheezing.  Abdominal:     General: Bowel sounds are normal. There is no distension.     Palpations: Abdomen is soft.     Tenderness: There is no abdominal tenderness.  Musculoskeletal: Normal range of motion.        General: No tenderness.     Right lower leg: Edema (trace) present.     Left lower leg: Edema (trace) present.  Skin:    General: Skin is warm and dry.  Neurological:     Mental Status: Carolyn Parsons is alert and oriented to person, place, and time.     Cranial Nerves: No cranial nerve deficit.     Deep Tendon Reflexes: Reflexes are normal and symmetric.  Psychiatric:  Behavior: Behavior normal.        Thought Content: Thought content normal.        Judgment: Judgment normal.       BP (!) 148/63   Pulse (!) 57   Temp 97.8 F (36.6 C) (Oral)   Ht 4' 11.5" (1.511 m)   Wt 180 lb (81.6 kg)   BMI 35.75 kg/m      Assessment & Plan:  Carolyn Parsons comes in today with chief complaint of Medical Management of Chronic Issues   Diagnosis and orders addressed:  1. Cough - cetirizine (ZYRTEC) 10 MG tablet;  Take 1 tablet (10 mg total) by mouth daily.  Dispense: 90 tablet; Refill: 4 - CMP14+EGFR - CBC with Differential/Platelet  2. Allergic rhinitis due to pollen, unspecified seasonality - cetirizine (ZYRTEC) 10 MG tablet; Take 1 tablet (10 mg total) by mouth daily.  Dispense: 90 tablet; Refill: 4 - CMP14+EGFR - CBC with Differential/Platelet - fluticasone (FLONASE) 50 MCG/ACT nasal spray; Place 2 sprays into both nostrils daily.  Dispense: 16 g; Refill: 6  3. Essential hypertension - CMP14+EGFR - CBC with Differential/Platelet - amLODipine (NORVASC) 5 MG tablet; Take 1 tablet (5 mg total) by mouth daily.  Dispense: 90 tablet; Refill: 3 - metoprolol succinate (TOPROL-XL) 25 MG 24 hr tablet; Take 1 tablet (25 mg total) by mouth daily.  Dispense: 90 tablet; Refill: 3  4. Aortic atherosclerosis (HCC) - CMP14+EGFR - CBC with Differential/Platelet - Lipid panel  5. Gastroesophageal reflux disease, esophagitis presence not specified - CMP14+EGFR - CBC with Differential/Platelet  6. Hyperlipidemia, unspecified hyperlipidemia type - CMP14+EGFR - CBC with Differential/Platelet - Lipid panel - rosuvastatin (CRESTOR) 20 MG tablet; Take 1 tablet (20 mg total) by mouth daily.  Dispense: 90 tablet; Refill: 3  7. Obesity (BMI 30.0-34.9) - CMP14+EGFR - CBC with Differential/Platelet  8. Hypothyroidism, unspecified type - CMP14+EGFR - CBC with Differential/Platelet - TSH - levothyroxine (SYNTHROID) 25 MCG tablet; TAKE 1 TABLET BY MOUTH  DAILY BEFORE BREAKFAST  Dispense: 90 tablet; Refill: 3   Labs pending Health Maintenance reviewed Diet and exercise encouraged  Follow up plan: 6 months    Evelina Dun, FNP

## 2019-05-19 LAB — CBC WITH DIFFERENTIAL/PLATELET
Basophils Absolute: 0 10*3/uL (ref 0.0–0.2)
Basos: 0 %
EOS (ABSOLUTE): 0.5 10*3/uL — ABNORMAL HIGH (ref 0.0–0.4)
Eos: 6 %
Hematocrit: 45.4 % (ref 34.0–46.6)
Hemoglobin: 15 g/dL (ref 11.1–15.9)
Immature Grans (Abs): 0 10*3/uL (ref 0.0–0.1)
Immature Granulocytes: 0 %
Lymphocytes Absolute: 1.9 10*3/uL (ref 0.7–3.1)
Lymphs: 25 %
MCH: 30.1 pg (ref 26.6–33.0)
MCHC: 33 g/dL (ref 31.5–35.7)
MCV: 91 fL (ref 79–97)
Monocytes Absolute: 0.7 10*3/uL (ref 0.1–0.9)
Monocytes: 9 %
Neutrophils Absolute: 4.7 10*3/uL (ref 1.4–7.0)
Neutrophils: 60 %
Platelets: 201 10*3/uL (ref 150–450)
RBC: 4.99 x10E6/uL (ref 3.77–5.28)
RDW: 12.6 % (ref 11.7–15.4)
WBC: 7.8 10*3/uL (ref 3.4–10.8)

## 2019-05-19 LAB — CMP14+EGFR
ALT: 12 IU/L (ref 0–32)
AST: 19 IU/L (ref 0–40)
Albumin/Globulin Ratio: 1.9 (ref 1.2–2.2)
Albumin: 4.4 g/dL (ref 3.6–4.6)
Alkaline Phosphatase: 73 IU/L (ref 39–117)
BUN/Creatinine Ratio: 20 (ref 12–28)
BUN: 16 mg/dL (ref 8–27)
Bilirubin Total: 0.5 mg/dL (ref 0.0–1.2)
CO2: 22 mmol/L (ref 20–29)
Calcium: 9.4 mg/dL (ref 8.7–10.3)
Chloride: 103 mmol/L (ref 96–106)
Creatinine, Ser: 0.8 mg/dL (ref 0.57–1.00)
GFR calc Af Amer: 78 mL/min/{1.73_m2} (ref >59)
GFR calc non Af Amer: 68 mL/min/{1.73_m2} (ref >59)
Globulin, Total: 2.3 g/dL (ref 1.5–4.5)
Glucose: 103 mg/dL — ABNORMAL HIGH (ref 65–99)
Potassium: 4.4 mmol/L (ref 3.5–5.2)
Sodium: 142 mmol/L (ref 134–144)
Total Protein: 6.7 g/dL (ref 6.0–8.5)

## 2019-05-19 LAB — LIPID PANEL
Chol/HDL Ratio: 3.5 ratio (ref 0.0–4.4)
Cholesterol, Total: 128 mg/dL (ref 100–199)
HDL: 37 mg/dL — ABNORMAL LOW (ref >39)
LDL Calculated: 68 mg/dL (ref 0–99)
Triglycerides: 115 mg/dL (ref 0–149)
VLDL Cholesterol Cal: 23 mg/dL (ref 5–40)

## 2019-05-19 LAB — TSH: TSH: 3.29 u[IU]/mL (ref 0.450–4.500)

## 2019-08-07 ENCOUNTER — Ambulatory Visit (INDEPENDENT_AMBULATORY_CARE_PROVIDER_SITE_OTHER): Payer: Medicare HMO

## 2019-08-07 DIAGNOSIS — Z23 Encounter for immunization: Secondary | ICD-10-CM

## 2019-09-17 DIAGNOSIS — H401131 Primary open-angle glaucoma, bilateral, mild stage: Secondary | ICD-10-CM | POA: Diagnosis not present

## 2019-11-24 ENCOUNTER — Ambulatory Visit: Payer: Medicare HMO | Admitting: Family

## 2019-11-27 ENCOUNTER — Ambulatory Visit: Payer: Medicare HMO | Admitting: Family

## 2019-11-27 ENCOUNTER — Encounter: Payer: Self-pay | Admitting: Family

## 2019-11-27 ENCOUNTER — Other Ambulatory Visit: Payer: Self-pay

## 2019-11-27 VITALS — BP 163/75 | HR 59 | Temp 98.2°F | Ht 59.5 in | Wt 182.2 lb

## 2019-11-27 DIAGNOSIS — J301 Allergic rhinitis due to pollen: Secondary | ICD-10-CM | POA: Diagnosis not present

## 2019-11-27 DIAGNOSIS — I700 Atherosclerosis of aorta: Secondary | ICD-10-CM

## 2019-11-27 DIAGNOSIS — E039 Hypothyroidism, unspecified: Secondary | ICD-10-CM

## 2019-11-27 DIAGNOSIS — R059 Cough, unspecified: Secondary | ICD-10-CM

## 2019-11-27 DIAGNOSIS — R05 Cough: Secondary | ICD-10-CM | POA: Diagnosis not present

## 2019-11-27 DIAGNOSIS — E669 Obesity, unspecified: Secondary | ICD-10-CM

## 2019-11-27 DIAGNOSIS — I10 Essential (primary) hypertension: Secondary | ICD-10-CM | POA: Diagnosis not present

## 2019-11-27 DIAGNOSIS — K219 Gastro-esophageal reflux disease without esophagitis: Secondary | ICD-10-CM

## 2019-11-27 DIAGNOSIS — E785 Hyperlipidemia, unspecified: Secondary | ICD-10-CM

## 2019-11-27 MED ORDER — LEVOTHYROXINE SODIUM 25 MCG PO TABS: ORAL_TABLET | ORAL | 3 refills | Status: DC

## 2019-11-27 MED ORDER — CETIRIZINE HCL 10 MG PO TABS: 10.0000 mg | ORAL_TABLET | Freq: Every day | ORAL | 11 refills | Status: DC

## 2019-11-27 MED ORDER — AMLODIPINE BESYLATE 5 MG PO TABS: 5.0000 mg | ORAL_TABLET | Freq: Every day | ORAL | 3 refills | Status: DC

## 2019-11-27 MED ORDER — FLUTICASONE PROPIONATE 50 MCG/ACT NA SUSP: 2.0000 | Freq: Every day | NASAL | 6 refills | Status: DC

## 2019-11-27 MED ORDER — COMBIGAN 0.2-0.5 % OP SOLN: 1.0000 [drp] | Freq: Two times a day (BID) | OPHTHALMIC | 2 refills | Status: DC

## 2019-11-27 MED ORDER — ROSUVASTATIN CALCIUM 20 MG PO TABS: 20.0000 mg | ORAL_TABLET | Freq: Every day | ORAL | 3 refills | Status: DC

## 2019-11-27 MED ORDER — METOPROLOL SUCCINATE ER 25 MG PO TB24: 25.0000 mg | ORAL_TABLET | Freq: Every day | ORAL | 3 refills | Status: DC

## 2019-11-27 NOTE — Patient Instructions (Signed)
Health Maintenance After Age 84 After age 84, you are at a higher risk for certain long-term diseases and infections as well as injuries from falls. Falls are a major cause of broken bones and head injuries in people who are older than age 84. Getting regular preventive care can help to keep you healthy and well. Preventive care includes getting regular testing and making lifestyle changes as recommended by your health care provider. Talk with your health care provider about:  Which screenings and tests you should have. A screening is a test that checks for a disease when you have no symptoms.  A diet and exercise plan that is right for you. What should I know about screenings and tests to prevent falls? Screening and testing are the best ways to find a health problem early. Early diagnosis and treatment give you the best chance of managing medical conditions that are common after age 84. Certain conditions and lifestyle choices may make you more likely to have a fall. Your health care provider may recommend:  Regular vision checks. Poor vision and conditions such as cataracts can make you more likely to have a fall. If you wear glasses, make sure to get your prescription updated if your vision changes.  Medicine review. Work with your health care provider to regularly review all of the medicines you are taking, including over-the-counter medicines. Ask your health care provider about any side effects that may make you more likely to have a fall. Tell your health care provider if any medicines that you take make you feel dizzy or sleepy.  Osteoporosis screening. Osteoporosis is a condition that causes the bones to get weaker. This can make the bones weak and cause them to break more easily.  Blood pressure screening. Blood pressure changes and medicines to control blood pressure can make you feel dizzy.  Strength and balance checks. Your health care provider may recommend certain tests to check your  strength and balance while standing, walking, or changing positions.  Foot health exam. Foot pain and numbness, as well as not wearing proper footwear, can make you more likely to have a fall.  Depression screening. You may be more likely to have a fall if you have a fear of falling, feel emotionally low, or feel unable to do activities that you used to do.  Alcohol use screening. Using too much alcohol can affect your balance and may make you more likely to have a fall. What actions can I take to lower my risk of falls? General instructions  Talk with your health care provider about your risks for falling. Tell your health care provider if: ? You fall. Be sure to tell your health care provider about all falls, even ones that seem minor. ? You feel dizzy, sleepy, or off-balance.  Take over-the-counter and prescription medicines only as told by your health care provider. These include any supplements.  Eat a healthy diet and maintain a healthy weight. A healthy diet includes low-fat dairy products, low-fat (lean) meats, and fiber from whole grains, beans, and lots of fruits and vegetables. Home safety  Remove any tripping hazards, such as rugs, cords, and clutter.  Install safety equipment such as grab bars in bathrooms and safety rails on stairs.  Keep rooms and walkways well-lit. Activity   Follow a regular exercise program to stay fit. This will help you maintain your balance. Ask your health care provider what types of exercise are appropriate for you.  If you need a cane or   walker, use it as recommended by your health care provider.  Wear supportive shoes that have nonskid soles. Lifestyle  Do not drink alcohol if your health care provider tells you not to drink.  If you drink alcohol, limit how much you have: ? 0-1 drink a day for women. ? 0-2 drinks a day for men.  Be aware of how much alcohol is in your drink. In the [place] one drink equals one typical bottle of beer (12  oz), one-half glass of wine (5 oz), or one shot of hard liquor (1 oz).  Do not use any products that contain nicotine or tobacco, such as cigarettes and e-cigarettes. If you need help quitting, ask your health care provider. Summary  Having a healthy lifestyle and getting preventive care can help to protect your health and wellness after age 84.  Screening and testing are the best way to find a health problem early and help you avoid having a fall. Early diagnosis and treatment give you the best chance for managing medical conditions that are more common for people who are older than age 84.  Falls are a major cause of broken bones and head injuries in people who are older than age 84. Take precautions to prevent a fall at home.  Work with your health care provider to learn what changes you can make to improve your health and wellness and to prevent falls. This information is not intended to replace advice given to you by your health care provider. Make sure you discuss any questions you have with your health care provider. Document Revised: 02/05/2019 Document Reviewed: 08/28/2017 Elsevier Patient Education  2020 [employer]  

## 2019-11-27 NOTE — Progress Notes (Signed)
Subjective:    Patient ID: Carolyn Parsons, female    DOB: 06-26-1932, 86 y.o.   MRN: [id number]  Chief Complaint  Patient presents with  . Medical Management of Chronic Issues    6 mth    Pt presents to the office today for chronic follow up.  Hypertension This is a chronic problem. The current episode started more than 1 year ago. The problem has been waxing and waning since onset. The problem is uncontrolled. Associated symptoms include malaise/fatigue. Pertinent negatives include no chest pain, peripheral edema or shortness of breath. Risk factors for coronary artery disease include dyslipidemia, obesity and sedentary lifestyle. The current treatment provides moderate improvement. There is no history of kidney disease, CVA or heart failure. Identifiable causes of hypertension include a thyroid problem.  Gastroesophageal Reflux She complains of abdominal pain and heartburn. She reports no belching or no chest pain. This is a chronic problem. The current episode started more than 1 year ago. The problem occurs occasionally. The problem has been waxing and waning. Associated symptoms include fatigue. Risk factors include obesity. She has tried a PPI for the symptoms. The treatment provided moderate relief.  Thyroid Problem Presents for follow-up visit. Symptoms include fatigue. Patient reports no constipation, depressed mood or diarrhea. The symptoms have been stable. Her past medical history is significant for hyperlipidemia. There is no history of heart failure.  Hyperlipidemia This is a chronic problem. The current episode started more than 1 year ago. The problem is controlled. Recent lipid tests were reviewed and are normal. Exacerbating diseases include hypothyroidism. Pertinent negatives include no chest pain or shortness of breath. Current antihyperlipidemic treatment includes statins. The current treatment provides moderate improvement of lipids. Risk factors for coronary artery disease  include hypertension, a sedentary lifestyle, post-menopausal and dyslipidemia.      Review of Systems  Constitutional: Positive for fatigue and malaise/fatigue.  Respiratory: Negative for shortness of breath.   Cardiovascular: Negative for chest pain.  Gastrointestinal: Positive for abdominal pain and heartburn. Negative for constipation and diarrhea.  All other systems reviewed and are negative.      Objective:   Physical Exam Vitals reviewed.  Constitutional:      General: She is not in acute distress.    Appearance: She is well-developed. She is obese.  HENT:     Head: Normocephalic and atraumatic.     Right Ear: Tympanic membrane normal.     Left Ear: Tympanic membrane normal.  Eyes:     Pupils: Pupils are equal, round, and reactive to light.     Comments: tearing  Neck:     Thyroid: No thyromegaly.  Cardiovascular:     Rate and Rhythm: Normal rate and regular rhythm.     Heart sounds: Normal heart sounds. No murmur.  Pulmonary:     Effort: Pulmonary effort is normal. No respiratory distress.     Breath sounds: Normal breath sounds. No wheezing.  Abdominal:     General: Bowel sounds are normal. There is no distension.     Palpations: Abdomen is soft.     Tenderness: There is no abdominal tenderness.  Musculoskeletal:        General: No tenderness. Normal range of motion.     Cervical back: Normal range of motion and neck supple.  Skin:    General: Skin is warm and dry.  Neurological:     Mental Status: She is alert and oriented to person, place, and time.     Cranial Nerves: No cranial  nerve deficit.     Deep Tendon Reflexes: Reflexes are normal and symmetric.  Psychiatric:        Behavior: Behavior normal.        Thought Content: Thought content normal.        Judgment: Judgment normal.       BP (!) 180/67   Pulse (!) 59   Temp 98.2 F (36.8 C) (Temporal)   Ht 4' 11.5" (1.511 m)   Wt 182 lb 3.2 oz (82.6 kg)   SpO2 97%   BMI 36.18 kg/m        Assessment & Plan:  Carolyn Parsons comes in today with chief complaint of Medical Management of Chronic Issues (6 mth )   Diagnosis and orders addressed:  1. Cough - cetirizine (ZYRTEC) 10 MG tablet; Take 1 tablet (10 mg total) by mouth daily.  Dispense: 30 tablet; Refill: 11 - CMP14+EGFR - CBC with Differential/Platelet  2. Allergic rhinitis due to pollen, unspecified seasonality - cetirizine (ZYRTEC) 10 MG tablet; Take 1 tablet (10 mg total) by mouth daily.  Dispense: 30 tablet; Refill: 11 - fluticasone (FLONASE) 50 MCG/ACT nasal spray; Place 2 sprays into both nostrils daily.  Dispense: 16 g; Refill: 6 - CMP14+EGFR - CBC with Differential/Platelet  3. Essential hypertension - amLODipine (NORVASC) 5 MG tablet; Take 1 tablet (5 mg total) by mouth daily.  Dispense: 90 tablet; Refill: 3 - metoprolol succinate (TOPROL-XL) 25 MG 24 hr tablet; Take 1 tablet (25 mg total) by mouth daily.  Dispense: 90 tablet; Refill: 3 - CMP14+EGFR - CBC with Differential/Platelet  4. Hyperlipidemia, unspecified hyperlipidemia type - rosuvastatin (CRESTOR) 20 MG tablet; Take 1 tablet (20 mg total) by mouth daily.  Dispense: 90 tablet; Refill: 3 - CMP14+EGFR - CBC with Differential/Platelet  5. Hypothyroidism, unspecified type - levothyroxine (SYNTHROID) 25 MCG tablet; TAKE 1 TABLET BY MOUTH  DAILY BEFORE BREAKFAST  Dispense: 90 tablet; Refill: 3 - CMP14+EGFR - CBC with Differential/Platelet - TSH  6. Aortic atherosclerosis (HCC) - CMP14+EGFR - CBC with Differential/Platelet  7. Gastroesophageal reflux disease, unspecified whether esophagitis present - CMP14+EGFR - CBC with Differential/Platelet  8. Obesity (BMI 30.0-34.9)  - CMP14+EGFR - CBC with Differential/Platelet   Labs pending Health Maintenance reviewed Diet and exercise encouraged  Follow up plan: 6 months    Evelina Dun, FNP

## 2019-11-28 LAB — CMP14+EGFR
ALT: 10 IU/L (ref 0–32)
AST: 18 IU/L (ref 0–40)
Albumin/Globulin Ratio: 1.6 (ref 1.2–2.2)
Albumin: 4.2 g/dL (ref 3.6–4.6)
Alkaline Phosphatase: 76 IU/L (ref 39–117)
BUN/Creatinine Ratio: 17 (ref 12–28)
BUN: 15 mg/dL (ref 8–27)
Bilirubin Total: 0.4 mg/dL (ref 0.0–1.2)
CO2: 24 mmol/L (ref 20–29)
Calcium: 9 mg/dL (ref 8.7–10.3)
Chloride: 102 mmol/L (ref 96–106)
Creatinine, Ser: 0.88 mg/dL (ref 0.57–1.00)
GFR calc Af Amer: 69 mL/min/{1.73_m2} (ref >59)
GFR calc non Af Amer: 60 mL/min/{1.73_m2} (ref >59)
Globulin, Total: 2.6 g/dL (ref 1.5–4.5)
Glucose: 107 mg/dL — ABNORMAL HIGH (ref 65–99)
Potassium: 4.8 mmol/L (ref 3.5–5.2)
Sodium: 139 mmol/L (ref 134–144)
Total Protein: 6.8 g/dL (ref 6.0–8.5)

## 2019-11-28 LAB — CBC WITH DIFFERENTIAL/PLATELET
Basophils Absolute: 0 10*3/uL (ref 0.0–0.2)
Basos: 1 %
EOS (ABSOLUTE): 0.4 10*3/uL (ref 0.0–0.4)
Eos: 5 %
Hematocrit: 45.2 % (ref 34.0–46.6)
Hemoglobin: 15.2 g/dL (ref 11.1–15.9)
Immature Grans (Abs): 0 10*3/uL (ref 0.0–0.1)
Immature Granulocytes: 0 %
Lymphocytes Absolute: 1.9 10*3/uL (ref 0.7–3.1)
Lymphs: 22 %
MCH: 31 pg (ref 26.6–33.0)
MCHC: 33.6 g/dL (ref 31.5–35.7)
MCV: 92 fL (ref 79–97)
Monocytes Absolute: 0.7 10*3/uL (ref 0.1–0.9)
Monocytes: 8 %
Neutrophils Absolute: 5.6 10*3/uL (ref 1.4–7.0)
Neutrophils: 64 %
Platelets: 204 10*3/uL (ref 150–450)
RBC: 4.91 x10E6/uL (ref 3.77–5.28)
RDW: 12.3 % (ref 11.7–15.4)
WBC: 8.7 10*3/uL (ref 3.4–10.8)

## 2019-11-28 LAB — TSH: TSH: 3.81 u[IU]/mL (ref 0.450–4.500)

## 2020-01-01 ENCOUNTER — Ambulatory Visit: Payer: Medicare HMO | Attending: Internal Medicine

## 2020-01-01 DIAGNOSIS — Z23 Encounter for immunization: Secondary | ICD-10-CM

## 2020-01-01 NOTE — Progress Notes (Signed)
   Covid-19 Vaccination Clinic  Name:  Carolyn Parsons    MRN: [id number] DOB: [birth year]  01/01/2020  Carolyn Parsons was observed post Covid-19 immunization for 15 minutes without incident. She was provided with Vaccine Information Sheet and instruction to access the V-Safe system.   Carolyn Parsons was instructed to call 911 with any severe reactions post vaccine: Marland Kitchen Difficulty breathing  . Swelling of face and throat  . A fast heartbeat  . A bad rash all over body  . Dizziness and weakness   Immunizations Administered    Name Date Dose VIS Date Route   Moderna COVID-19 Vaccine 01/01/2020  8:35 AM 0.5 mL 09/29/2019 Intramuscular   Manufacturer: Moderna   Lot: [id number]   NDC: 68115-726-20

## 2020-02-02 ENCOUNTER — Ambulatory Visit: Payer: Medicare HMO | Attending: Internal Medicine

## 2020-02-02 ENCOUNTER — Other Ambulatory Visit: Payer: Self-pay | Admitting: Family

## 2020-02-02 DIAGNOSIS — Z23 Encounter for immunization: Secondary | ICD-10-CM

## 2020-02-02 NOTE — Progress Notes (Signed)
   Covid-19 Vaccination Clinic  Name:  Analese Sovine    MRN: [id number] DOB: 05-28-1932  02/02/2020  Ms. Cargile was observed post Covid-19 immunization for 15 minutes without incident. She was provided with Vaccine Information Sheet and instruction to access the V-Safe system.   Ms. Matzek was instructed to call 911 with any severe reactions post vaccine: Marland Kitchen Difficulty breathing  . Swelling of face and throat  . A fast heartbeat  . A bad rash all over body  . Dizziness and weakness   Immunizations Administered    Name Date Dose VIS Date Route   Moderna COVID-19 Vaccine 02/02/2020  8:38 AM 0.5 mL 09/29/2019 Intramuscular   Manufacturer: Moderna   Lot: [id number]   NDC: 47829-562-13

## 2020-03-23 ENCOUNTER — Encounter: Payer: Self-pay | Admitting: Family Medicine

## 2020-03-23 ENCOUNTER — Ambulatory Visit (INDEPENDENT_AMBULATORY_CARE_PROVIDER_SITE_OTHER): Payer: Medicare HMO | Admitting: Family Medicine

## 2020-03-23 DIAGNOSIS — J329 Chronic sinusitis, unspecified: Secondary | ICD-10-CM | POA: Diagnosis not present

## 2020-03-23 DIAGNOSIS — J40 Bronchitis, not specified as acute or chronic: Secondary | ICD-10-CM

## 2020-03-23 MED ORDER — BENZONATATE 200 MG PO CAPS: 200.0000 mg | ORAL_CAPSULE | Freq: Three times a day (TID) | ORAL | 0 refills | Status: DC | PRN

## 2020-03-23 MED ORDER — AZITHROMYCIN 250 MG PO TABS: ORAL_TABLET | ORAL | 0 refills | Status: DC

## 2020-03-23 NOTE — Progress Notes (Signed)
Subjective:    Patient ID: Carolyn Parsons, female    DOB: 02-26-1932, 86 y.o.   MRN: [id number]   HPI: Carolyn Parsons is a 86 y.o. female presenting for coughing constantly day and night. Clear sputum. Onset 3 days ago. Has posterior drainage.  No fever chills or sweats.  Denies shortness of breath.  Some nasal congestion and sinus pressure.   Depression screen [facility] 2/9 11/27/2019 05/18/2019 12/01/2018 11/18/2018 11/14/2018  Decreased Interest 2 0 0 0 0  Down, Depressed, Hopeless 2 0 0 0 0  PHQ - 2 Score 4 0 0 0 0  Altered sleeping 0 - - - -  Tired, decreased energy 1 - - - -  Change in appetite 0 - - - -  Feeling bad or failure about yourself  0 - - - -  Trouble concentrating 0 - - - -  Moving slowly or fidgety/restless 0 - - - -  Suicidal thoughts 0 - - - -  PHQ-9 Score 5 - - - -  Difficult doing work/chores Not difficult at all - - - -     Relevant past medical, surgical, family and social history reviewed and updated as indicated.  Interim medical history since our last visit reviewed. Allergies and medications reviewed and updated.  ROS:  Review of Systems  Constitutional: Negative for appetite change, chills, diaphoresis, fatigue and fever.  HENT: Positive for congestion, postnasal drip and rhinorrhea. Negative for ear pain, sore throat and trouble swallowing.   Respiratory: Negative for cough, chest tightness and shortness of breath.   Cardiovascular: Negative for chest pain and palpitations.  Gastrointestinal: Negative for abdominal pain.  Musculoskeletal: Negative for arthralgias.  Skin: Negative for rash.     Social History   Tobacco Use  Smoking Status Former Smoker  . Packs/day: 0.25  . Years: 20.00  . Pack years: 5.00  . Quit date: 10/29/1993  . Years since quitting: 26.4  Smokeless Tobacco Never Used       Objective:     Wt Readings from Last 3 Encounters:  11/27/19 182 lb 3.2 oz (82.6 kg)  05/18/19 180 lb (81.6 kg)  12/01/18 174 lb (78.9 kg)     Exam  deferred. Pt. Harboring due to COVID 19. Phone visit performed.   Assessment & Plan:   1. Sinobronchitis     Meds ordered this encounter  Medications  . azithromycin (ZITHROMAX Z-PAK) 250 MG tablet    Sig: Take two right away Then one a day for the next 4 days.    Dispense:  6 each    Refill:  0  . benzonatate (TESSALON) 200 MG capsule    Sig: Take 1 capsule (200 mg total) by mouth 3 (three) times daily as needed for cough.    Dispense:  20 capsule    Refill:  0    No orders of the defined types were placed in this encounter.     Diagnoses and all orders for this visit:  Sinobronchitis  Other orders -     azithromycin (ZITHROMAX Z-PAK) 250 MG tablet; Take two right away Then one a day for the next 4 days. -     benzonatate (TESSALON) 200 MG capsule; Take 1 capsule (200 mg total) by mouth 3 (three) times daily as needed for cough.    Virtual Visit via telephone Note  I discussed the limitations, risks, security and privacy concerns of performing an evaluation and management service by telephone and the availability of in person  appointments. The patient was identified with two identifiers. Pt.expressed understanding and agreed to proceed. Pt. Is at home. Dr. Livia Snellen is in his office.  Follow Up Instructions:   I discussed the assessment and treatment plan with the patient. The patient was provided an opportunity to ask questions and all were answered. The patient agreed with the plan and demonstrated an understanding of the instructions.   The patient was advised to call back or seek an in-person evaluation if the symptoms worsen or if the condition fails to improve as anticipated.   Total minutes including chart review and phone contact time: 8   Follow up plan: Return if symptoms worsen or fail to improve.  Claretta Fraise, MD [facility]

## 2020-03-25 ENCOUNTER — Telehealth: Payer: Self-pay | Admitting: Family

## 2020-03-25 NOTE — Telephone Encounter (Signed)
Suggestions please? 

## 2020-03-25 NOTE — Telephone Encounter (Signed)
Aware of provider's advice. 

## 2020-03-25 NOTE — Telephone Encounter (Signed)
She was given antibiotic, this takes time to work. She can get OTC cough syrup to take as needed. Rest, force fluids.

## 2020-03-25 NOTE — Telephone Encounter (Signed)
Pts daughter called stating that she had a televisit with Dr Darlyn Read recently for a cough and was prescribed some meds to take. Daughter says its been a few days and pt is taking the medication as directed but still has cough; no improvement.Wants advice.

## 2020-03-30 ENCOUNTER — Encounter: Payer: Self-pay | Admitting: Family Medicine

## 2020-03-30 ENCOUNTER — Other Ambulatory Visit: Payer: Self-pay | Admitting: Family Medicine

## 2020-03-30 MED ORDER — LEVOFLOXACIN 500 MG PO TABS: 500.0000 mg | ORAL_TABLET | Freq: Every day | ORAL | 0 refills | Status: DC

## 2020-04-28 ENCOUNTER — Encounter: Payer: Self-pay | Admitting: Family Medicine

## 2020-05-09 ENCOUNTER — Other Ambulatory Visit: Payer: Self-pay

## 2020-05-09 ENCOUNTER — Ambulatory Visit (INDEPENDENT_AMBULATORY_CARE_PROVIDER_SITE_OTHER): Payer: Medicare HMO | Admitting: Family Medicine

## 2020-05-09 ENCOUNTER — Encounter: Payer: Self-pay | Admitting: Family Medicine

## 2020-05-09 VITALS — BP 176/63 | HR 66 | Temp 98.9°F | Ht 59.0 in | Wt 175.0 lb

## 2020-05-09 DIAGNOSIS — J301 Allergic rhinitis due to pollen: Secondary | ICD-10-CM

## 2020-05-09 MED ORDER — PREDNISONE 20 MG PO TABS: ORAL_TABLET | ORAL | 0 refills | Status: DC

## 2020-05-09 NOTE — Progress Notes (Signed)
BP (!) 176/63   Pulse 66   Temp 98.9 F (37.2 C)   Ht 4\' 11"  (1.499 m)   Wt 175 lb (79.4 kg)   SpO2 97%   BMI 35.35 kg/m    Subjective:   Patient ID: , female    DOB: [birth year], 86 y.o.   MRN: [id number]  HPI: Carolyn Parsons is a 86 y.o. female presenting on 05/09/2020 for Viral Respiratory Illness (flared x2 weeks)   HPI Patient is coming in today complaining of cough and congestion that has been in reality going on for the past month, she has tried doing different antihistamines and changing them and it does not seem to be making his big of a difference.  She just feels like its not improving significantly.  Relevant past medical, surgical, family and social history reviewed and updated as indicated. Interim medical history since our last visit reviewed. Allergies and medications reviewed and updated.  Review of Systems  Constitutional: Negative for chills and fever.  HENT: Positive for congestion, postnasal drip and rhinorrhea. Negative for ear discharge, ear pain, sinus pressure, sneezing and sore throat.   Eyes: Negative for pain, redness and visual disturbance.  Respiratory: Positive for cough. Negative for chest tightness and shortness of breath.   Cardiovascular: Negative for chest pain and leg swelling.  Genitourinary: Negative for difficulty urinating and dysuria.  Musculoskeletal: Negative for back pain and gait problem.  Skin: Negative for rash.  Neurological: Negative for light-headedness and headaches.  Psychiatric/Behavioral: Negative for agitation and behavioral problems.  All other systems reviewed and are negative.   Per HPI unless specifically indicated above    Objective:   BP (!) 176/63   Pulse 66   Temp 98.9 F (37.2 C)   Ht 4\' 11"  (1.499 m)   Wt 175 lb (79.4 kg)   SpO2 97%   BMI 35.35 kg/m   Wt Readings from Last 3 Encounters:  05/09/20 175 lb (79.4 kg)  11/27/19 182 lb 3.2 oz (82.6 kg)  05/18/19 180 lb (81.6 kg)    Physical  Exam Vitals reviewed.  Constitutional:      General: She is not in acute distress.    Appearance: She is well-developed. She is not diaphoretic.  HENT:     Right Ear: Tympanic membrane, ear canal and external ear normal.     Left Ear: Tympanic membrane, ear canal and external ear normal.     Nose: Mucosal edema and rhinorrhea present.     Right Sinus: No maxillary sinus tenderness or frontal sinus tenderness.     Left Sinus: No maxillary sinus tenderness or frontal sinus tenderness.     Mouth/Throat:     Pharynx: Uvula midline. No oropharyngeal exudate or posterior oropharyngeal erythema.     Tonsils: No tonsillar abscesses.  Eyes:     Conjunctiva/sclera: Conjunctivae normal.  Cardiovascular:     Rate and Rhythm: Normal rate and regular rhythm.     Heart sounds: Normal heart sounds. No murmur heard.   Pulmonary:     Effort: Pulmonary effort is normal. No respiratory distress.     Breath sounds: Normal breath sounds. No wheezing.  Musculoskeletal:        General: No tenderness. Normal range of motion.  Skin:    General: Skin is warm and dry.     Findings: No rash.  Neurological:     Mental Status: She is alert and oriented to person, place, and time.     Coordination: Coordination normal.  Psychiatric:        Behavior: Behavior normal.       Assessment & Plan:   Problem List Items Addressed This Visit    None    Visit Diagnoses    Seasonal allergic rhinitis due to pollen    -  Primary   Relevant Medications   predniSONE (DELTASONE) 20 MG tablet      Give a short course of prednisone to see if we can suppress it and then if that does not work then she is going to try taking her pantoprazole every day for at least 2 weeks and see if that suppresses it. Follow up plan: Return if symptoms worsen or fail to improve.  Counseling provided for all of the vaccine components No orders of the defined types were placed in this encounter.   Arville Care, MD [facility] 05/09/2020, 5:16 PM

## 2020-05-27 ENCOUNTER — Encounter: Payer: Self-pay | Admitting: Family

## 2020-05-27 ENCOUNTER — Ambulatory Visit (INDEPENDENT_AMBULATORY_CARE_PROVIDER_SITE_OTHER): Payer: Medicare HMO | Admitting: Family

## 2020-05-27 ENCOUNTER — Other Ambulatory Visit: Payer: Self-pay

## 2020-05-27 VITALS — BP 141/59 | HR 56 | Temp 97.6°F | Ht 59.0 in | Wt 171.8 lb

## 2020-05-27 DIAGNOSIS — E66811 Obesity, class 1: Secondary | ICD-10-CM

## 2020-05-27 DIAGNOSIS — K219 Gastro-esophageal reflux disease without esophagitis: Secondary | ICD-10-CM | POA: Diagnosis not present

## 2020-05-27 DIAGNOSIS — E039 Hypothyroidism, unspecified: Secondary | ICD-10-CM | POA: Diagnosis not present

## 2020-05-27 DIAGNOSIS — J301 Allergic rhinitis due to pollen: Secondary | ICD-10-CM

## 2020-05-27 DIAGNOSIS — I10 Essential (primary) hypertension: Secondary | ICD-10-CM | POA: Diagnosis not present

## 2020-05-27 DIAGNOSIS — I700 Atherosclerosis of aorta: Secondary | ICD-10-CM

## 2020-05-27 DIAGNOSIS — E785 Hyperlipidemia, unspecified: Secondary | ICD-10-CM

## 2020-05-27 DIAGNOSIS — E669 Obesity, unspecified: Secondary | ICD-10-CM

## 2020-05-27 MED ORDER — FEXOFENADINE HCL 180 MG PO TABS: 180.0000 mg | ORAL_TABLET | Freq: Every day | ORAL | 2 refills | Status: DC

## 2020-05-27 NOTE — Patient Instructions (Signed)
Gastroesophageal Reflux Disease, Adult Gastroesophageal reflux (GER) happens when acid from the stomach flows up into the tube that connects the mouth and the stomach (esophagus). Normally, food travels down the esophagus and stays in the stomach to be digested. However, when a person has GER, food and stomach acid sometimes move back up into the esophagus. If this becomes a more serious problem, the person may be diagnosed with a disease called gastroesophageal reflux disease (GERD). GERD occurs when the reflux:  Happens often.  Causes frequent or severe symptoms.  Causes problems such as damage to the esophagus. When stomach acid comes in contact with the esophagus, the acid may cause soreness (inflammation) in the esophagus. Over time, GERD may create small holes (ulcers) in the lining of the esophagus. What are the causes? This condition is caused by a problem with the muscle between the esophagus and the stomach (lower esophageal sphincter, or LES). Normally, the LES muscle closes after food passes through the esophagus to the stomach. When the LES is weakened or abnormal, it does not close properly, and that allows food and stomach acid to go back up into the esophagus. The LES can be weakened by certain dietary substances, medicines, and medical conditions, including:  Tobacco use.  Pregnancy.  Having a hiatal hernia.  Alcohol use.  Certain foods and beverages, such as coffee, chocolate, onions, and peppermint. What increases the risk? You are more likely to develop this condition if you:  Have an increased body weight.  Have a connective tissue disorder.  Use NSAID medicines. What are the signs or symptoms? Symptoms of this condition include:  Heartburn.  Difficult or painful swallowing.  The feeling of having a lump in the throat.  Abitter taste in the mouth.  Bad breath.  Having a large amount of saliva.  Having an upset or bloated  stomach.  Belching.  Chest pain. Different conditions can cause chest pain. Make sure you see your health care provider if you experience chest pain.  Shortness of breath or wheezing.  Ongoing (chronic) cough or a night-time cough.  Wearing away of tooth enamel.  Weight loss. How is this diagnosed? Your health care provider will take a medical history and perform a physical exam. To determine if you have mild or severe GERD, your health care provider may also monitor how you respond to treatment. You may also have tests, including:  A test to examine your stomach and esophagus with a small camera (endoscopy).  A test thatmeasures the acidity level in your esophagus.  A test thatmeasures how much pressure is on your esophagus.  A barium swallow or modified barium swallow test to show the shape, size, and functioning of your esophagus. How is this treated? The goal of treatment is to help relieve your symptoms and to prevent complications. Treatment for this condition may vary depending on how severe your symptoms are. Your health care provider may recommend:  Changes to your diet.  Medicine.  Surgery. Follow these instructions at home: Eating and drinking   Follow a diet as recommended by your health care provider. This may involve avoiding foods and drinks such as: ? Coffee and tea (with or without caffeine). ? Drinks that containalcohol. ? Energy drinks and sports drinks. ? Carbonated drinks or sodas. ? Chocolate and cocoa. ? Peppermint and mint flavorings. ? Garlic and onions. ? Horseradish. ? Spicy and acidic foods, including peppers, chili powder, curry powder, vinegar, hot sauces, and barbecue sauce. ? Citrus fruit juices and citrus   fruits, such as oranges, lemons, and limes. ? Tomato-based foods, such as red sauce, chili, salsa, and pizza with red sauce. ? Fried and fatty foods, such as donuts, french fries, potato chips, and high-fat dressings. ? High-fat  meats, such as hot dogs and fatty cuts of red and white meats, such as rib eye steak, sausage, ham, and bacon. ? High-fat dairy items, such as whole milk, butter, and cream cheese.  Eat small, frequent meals instead of large meals.  Avoid drinking large amounts of liquid with your meals.  Avoid eating meals during the 2-3 hours before bedtime.  Avoid lying down right after you eat.  Do not exercise right after you eat. Lifestyle   Do not use any products that contain nicotine or tobacco, such as cigarettes, e-cigarettes, and chewing tobacco. If you need help quitting, ask your health care provider.  Try to reduce your stress by using methods such as yoga or meditation. If you need help reducing stress, ask your health care provider.  If you are overweight, reduce your weight to an amount that is healthy for you. Ask your health care provider for guidance about a safe weight loss goal. General instructions  Pay attention to any changes in your symptoms.  Take over-the-counter and prescription medicines only as told by your health care provider. Do not take aspirin, ibuprofen, or other NSAIDs unless your health care provider told you to do so.  Wear loose-fitting clothing. Do not wear anything tight around your waist that causes pressure on your abdomen.  Raise (elevate) the head of your bed about 6 inches (15 cm).  Avoid bending over if this makes your symptoms worse.  Keep all follow-up visits as told by your health care provider. This is important. Contact a health care provider if:  You have: ? New symptoms. ? Unexplained weight loss. ? Difficulty swallowing or it hurts to swallow. ? Wheezing or a persistent cough. ? A hoarse voice.  Your symptoms do not improve with treatment. Get help right away if you:  Have pain in your arms, neck, jaw, teeth, or back.  Feel sweaty, dizzy, or light-headed.  Have chest pain or shortness of breath.  Vomit and your vomit looks  like blood or coffee grounds.  Faint.  Have stool that is bloody or black.  Cannot swallow, drink, or eat. Summary  Gastroesophageal reflux happens when acid from the stomach flows up into the esophagus. GERD is a disease in which the reflux happens often, causes frequent or severe symptoms, or causes problems such as damage to the esophagus.  Treatment for this condition may vary depending on how severe your symptoms are. Your health care provider may recommend diet and lifestyle changes, medicine, or surgery.  Contact a health care provider if you have new or worsening symptoms.  Take over-the-counter and prescription medicines only as told by your health care provider. Do not take aspirin, ibuprofen, or other NSAIDs unless your health care provider told you to do so.  Keep all follow-up visits as told by your health care provider. This is important. This information is not intended to replace advice given to you by your health care provider. Make sure you discuss any questions you have with your health care provider. Document Revised: 04/23/2018 Document Reviewed: 04/23/2018 [employer]vier Patient Education  2020 [employer]  

## 2020-05-27 NOTE — Progress Notes (Signed)
Subjective:    Patient ID: Carolyn Parsons, female    DOB: [birth year], 86 y.o.   MRN: [id number]  Chief Complaint  Patient presents with  . Medical Management of Chronic Issues    6 month   Pt   presents to the office today for chronic follow up.  Hypertension This is a chronic problem. The current episode started more than 1 year ago. The problem has been waxing and waning since onset. The problem is controlled. Associated symptoms include malaise/fatigue. Pertinent negatives include no peripheral edema or shortness of breath. Risk factors for coronary artery disease include dyslipidemia, obesity and sedentary lifestyle. The current treatment provides moderate improvement. There is no history of kidney disease, CVA or heart failure. Identifiable causes of hypertension include a thyroid problem.  Gastroesophageal Reflux She complains of belching, coughing and heartburn. This is a chronic problem. The current episode started more than 1 year ago. The problem occurs occasionally. She has tried a PPI for the symptoms. The treatment provided moderate relief.  Thyroid Problem Presents for follow-up visit. Symptoms include depressed mood. Patient reports no anxiety, constipation or dry skin. The symptoms have been stable. Her past medical history is significant for hyperlipidemia. There is no history of heart failure.  Hyperlipidemia This is a chronic problem. The current episode started more than 1 year ago. The problem is controlled. Recent lipid tests were reviewed and are normal. Pertinent negatives include no shortness of breath. Current antihyperlipidemic treatment includes statins. The current treatment provides moderate improvement of lipids. Risk factors for coronary artery disease include dyslipidemia, hypertension, a sedentary lifestyle and post-menopausal.      Review of Systems  Constitutional: Positive for malaise/fatigue.  Respiratory: Positive for cough. Negative for shortness of  breath.   Gastrointestinal: Positive for heartburn. Negative for constipation.  Psychiatric/Behavioral: The patient is not nervous/anxious.   All other systems reviewed and are negative.      Objective:   Physical Exam Vitals reviewed.  Constitutional:      General: She is not in acute distress.    Appearance: She is well-developed.  HENT:     Head: Normocephalic and atraumatic.     Right Ear: Tympanic membrane normal.     Left Ear: Tympanic membrane normal.  Eyes:     Pupils: Pupils are equal, round, and reactive to light.  Neck:     Thyroid: No thyromegaly.  Cardiovascular:     Rate and Rhythm: Normal rate and regular rhythm.     Heart sounds: Murmur heard.   Pulmonary:     Effort: Pulmonary effort is normal. No respiratory distress.     Breath sounds: Normal breath sounds. No wheezing.  Abdominal:     General: Bowel sounds are normal. There is no distension.     Palpations: Abdomen is soft.     Tenderness: There is no abdominal tenderness.  Musculoskeletal:        General: No tenderness. Normal range of motion.     Cervical back: Normal range of motion and neck supple.  Skin:    General: Skin is warm and dry.  Neurological:     Mental Status: She is alert and oriented to person, place, and time.     Cranial Nerves: No cranial nerve deficit.     Deep Tendon Reflexes: Reflexes are normal and symmetric.  Psychiatric:        Behavior: Behavior normal.        Thought Content: Thought content normal.  Judgment: Judgment normal.       BP (!) 141/59   Pulse 56   Temp 97.6 F (36.4 C) (Temporal)   Ht _0  (1.499 m)   Wt 171 lb 12.8 oz (77.9 kg)   BMI 34.70 kg/m      Assessment & Plan:  Carolyn Parsons comes in today with chief complaint of Medical Management of Chronic Issues (6 month)   Diagnosis and orders addressed:  1. Aortic atherosclerosis (HCC) - CMP14+EGFR - CBC with Differential/Platelet  2. Essential hypertension - CMP14+EGFR - CBC with  Differential/Platelet  3. Gastroesophageal reflux disease, unspecified whether esophagitis present - CMP14+EGFR - CBC with Differential/Platelet  4. Hypothyroidism, unspecified type - CMP14+EGFR - CBC with Differential/Platelet - TSH  5. Hyperlipidemia, unspecified hyperlipidemia type - CMP14+EGFR - CBC with Differential/Platelet - Lipid panel  6. Obesity (BMI 30.0-34.9) - CMP14+EGFR - CBC with Differential/Platelet  7. Allergic rhinitis due to pollen, unspecified seasonality - CMP14+EGFR - CBC with Differential/Platelet - fexofenadine (ALLEGRA) 180 MG tablet; Take 1 tablet (180 mg total) by mouth daily.  Dispense: 90 tablet; Refill: 2   Labs pending Health Maintenance reviewed Diet and exercise encouraged  Follow up plan: 6 months    Evelina Dun, FNP

## 2020-05-28 LAB — CMP14+EGFR
ALT: 13 IU/L (ref 0–32)
AST: 18 IU/L (ref 0–40)
Albumin/Globulin Ratio: 1.5 (ref 1.2–2.2)
Albumin: 4 g/dL (ref 3.6–4.6)
Alkaline Phosphatase: 75 IU/L (ref 48–121)
BUN/Creatinine Ratio: 16 (ref 12–28)
BUN: 15 mg/dL (ref 8–27)
Bilirubin Total: 0.6 mg/dL (ref 0.0–1.2)
CO2: 23 mmol/L (ref 20–29)
Calcium: 9.2 mg/dL (ref 8.7–10.3)
Chloride: 104 mmol/L (ref 96–106)
Creatinine, Ser: 0.92 mg/dL (ref 0.57–1.00)
GFR calc Af Amer: 66 mL/min/{1.73_m2} (ref >59)
GFR calc non Af Amer: 57 mL/min/{1.73_m2} — ABNORMAL LOW (ref >59)
Globulin, Total: 2.7 g/dL (ref 1.5–4.5)
Glucose: 107 mg/dL — ABNORMAL HIGH (ref 65–99)
Potassium: 4.7 mmol/L (ref 3.5–5.2)
Sodium: 142 mmol/L (ref 134–144)
Total Protein: 6.7 g/dL (ref 6.0–8.5)

## 2020-05-28 LAB — CBC WITH DIFFERENTIAL/PLATELET
Basophils Absolute: 0 10*3/uL (ref 0.0–0.2)
Basos: 0 %
EOS (ABSOLUTE): 0.2 10*3/uL (ref 0.0–0.4)
Eos: 2 %
Hematocrit: 44 % (ref 34.0–46.6)
Hemoglobin: 15.1 g/dL (ref 11.1–15.9)
Immature Grans (Abs): 0 10*3/uL (ref 0.0–0.1)
Immature Granulocytes: 0 %
Lymphocytes Absolute: 1.9 10*3/uL (ref 0.7–3.1)
Lymphs: 22 %
MCH: 31.6 pg (ref 26.6–33.0)
MCHC: 34.3 g/dL (ref 31.5–35.7)
MCV: 92 fL (ref 79–97)
Monocytes Absolute: 0.5 10*3/uL (ref 0.1–0.9)
Monocytes: 6 %
Neutrophils Absolute: 6.1 10*3/uL (ref 1.4–7.0)
Neutrophils: 70 %
Platelets: 199 10*3/uL (ref 150–450)
RBC: 4.78 x10E6/uL (ref 3.77–5.28)
RDW: 12.5 % (ref 11.7–15.4)
WBC: 8.8 10*3/uL (ref 3.4–10.8)

## 2020-05-28 LAB — TSH: TSH: 3.94 u[IU]/mL (ref 0.450–4.500)

## 2020-05-28 LAB — LIPID PANEL
Chol/HDL Ratio: 3.7 ratio (ref 0.0–4.4)
Cholesterol, Total: 141 mg/dL (ref 100–199)
HDL: 38 mg/dL — ABNORMAL LOW (ref >39)
LDL Chol Calc (NIH): 79 mg/dL (ref 0–99)
Triglycerides: 137 mg/dL (ref 0–149)
VLDL Cholesterol Cal: 24 mg/dL (ref 5–40)

## 2020-06-28 ENCOUNTER — Ambulatory Visit (INDEPENDENT_AMBULATORY_CARE_PROVIDER_SITE_OTHER): Payer: Medicare HMO

## 2020-06-28 ENCOUNTER — Ambulatory Visit: Payer: Medicare HMO | Admitting: Orthopaedic Surgery

## 2020-06-28 ENCOUNTER — Encounter: Payer: Self-pay | Admitting: Orthopaedic Surgery

## 2020-06-28 VITALS — Ht 59.0 in | Wt 172.0 lb

## 2020-06-28 DIAGNOSIS — M545 Low back pain, unspecified: Secondary | ICD-10-CM

## 2020-06-28 NOTE — Progress Notes (Signed)
Office Visit Note   Patient: Carolyn Parsons           Date of Birth: 1932-06-14           MRN: [id number] Visit Date: 06/28/2020              Requested by: [name], FNP [address] [place],  [place] [address] PCP: [name], FNP   Assessment & Plan: Visit Diagnoses:  1. Acute left-sided low back pain, unspecified whether sciatica present     Plan: We'll set her up for single epidural injection left L4 foraminal injection with Dr. Alvester Morin which 2 years ago gave her excellent relief.  She can follow-up with me if she has persistent symptoms.  Follow-Up Instructions: No follow-ups on file.   Orders:  Orders Placed This Encounter  Procedures  . XR Lumbar Spine 2-3 Views   No orders of the defined types were placed in this encounter.     Procedures: No procedures performed   Clinical Data: No additional findings.   Subjective: Chief Complaint  Patient presents with  . Lower Back - Pain    HPI 86 year old female here with her daughter with recurrent back and left leg pain that radiates down to the lateral mid calf.  She has similar problems back in 2009.  She has some anterolisthesis at L4-5 and had moderate stenosis at that level.  She responded well to the left L4 foraminal injection.  She states her pain is constant whether she is sitting standing or walking no claudication symptoms no bowel bladder symptoms.  Patient states her pain is been significant for the last 6 days and she is requesting referral to Dr. Alvester Morin for repeat [facility].  Review of Systems all other systems are negative and noncontributory from 05/19/2018 office visit.   Objective: Vital Signs: Ht 4\' 11"  (1.499 m)   Wt 172 lb (78 kg)   BMI 34.74 kg/m   Physical Exam Constitutional:      Appearance: She is well-developed.  HENT:     Head: Normocephalic.     Right Ear: External ear normal.     Left Ear: External ear normal.  Eyes:     Pupils: Pupils are equal, round, and reactive  to light.  Neck:     Thyroid: No thyromegaly.     Trachea: No tracheal deviation.  Cardiovascular:     Rate and Rhythm: Normal rate.  Pulmonary:     Effort: Pulmonary effort is normal.  Abdominal:     Palpations: Abdomen is soft.  Skin:    General: Skin is warm and dry.  Neurological:     Mental Status: She is alert and oriented to person, place, and time.  Psychiatric:        Behavior: Behavior normal.     Ortho Exam patient has negative straight leg raising.  Negative logroll to the hips.  She has some trochanteric bursal tenderness static notch tenderness slightly worse on the left than right.  Negative FABER test.  No pain with reaching fingertips to ankle.  Specialty Comments:  No specialty comments available.  Imaging: Previous lumbar MRI 05/26/2017 showed moderate stenosis L4-5 with 5 mm slip posterior element hypertrophy.  There was some L3-4 moderate stenosis with right foraminal disc protrusion.   PMFS History: Patient Active Problem List   Diagnosis Date Noted  . Aortic atherosclerosis (HCC) 11/20/2018  . Hypothyroidism 11/14/2018  . Hyperlipemia 05/16/2018  . GERD (gastroesophageal reflux disease) 05/16/2018  . Hypertension  05/16/2018  . Obesity (BMI 30.0-34.9) 05/16/2018   Past Medical History:  Diagnosis Date  . Cataract   . Hyperlipidemia   . Hypertension     Family History  Problem Relation Age of Onset  . Cancer Mother   . Cancer Father        Brain   . Heart disease Sister   . Leukemia Sister   . Cancer Brother        Prostate  . Hyperlipidemia Daughter   . Hypertension Daughter   . Heart disease Son   . Dementia Sister   . Heart disease Son   . Hyperlipidemia Son   . Hypertension Son     Past Surgical History:  Procedure Laterality Date  . APPENDECTOMY    . BLADDER REPAIR    . CATARACT EXTRACTION, BILATERAL    . CHOLECYSTECTOMY     Social History   Occupational History  . Occupation: retired    Comment: school bus driver     Tobacco Use  . Smoking status: Former Smoker    Packs/day: 0.25    Years: 20.00    Pack years: 5.00    Quit date: 10/29/1993    Years since quitting: 26.6  . Smokeless tobacco: Never Used  Vaping Use  . Vaping Use: Never used  Substance and Sexual Activity  . Alcohol use: Never  . Drug use: Never  . Sexual activity: Not on file

## 2020-06-28 NOTE — Addendum Note (Signed)
Addended by: Rogers Seeds on: 06/28/2020 02:55 PM   Modules accepted: Orders

## 2020-07-25 ENCOUNTER — Telehealth: Payer: Self-pay | Admitting: Physical Medicine and Rehabilitation

## 2020-07-25 NOTE — Telephone Encounter (Signed)
Pts daughter called stating she missed a call in regards to the pt and would like a CB today or tomorrow.  [phone]

## 2020-07-26 NOTE — Telephone Encounter (Signed)
Pt was sch. 

## 2020-08-03 ENCOUNTER — Other Ambulatory Visit: Payer: Self-pay

## 2020-08-03 ENCOUNTER — Encounter: Payer: Self-pay | Admitting: Physical Medicine and Rehabilitation

## 2020-08-03 ENCOUNTER — Ambulatory Visit: Payer: Self-pay

## 2020-08-03 ENCOUNTER — Ambulatory Visit (INDEPENDENT_AMBULATORY_CARE_PROVIDER_SITE_OTHER): Payer: Medicare HMO | Admitting: Physical Medicine and Rehabilitation

## 2020-08-03 VITALS — BP 167/68 | HR 56

## 2020-08-03 DIAGNOSIS — M5416 Radiculopathy, lumbar region: Secondary | ICD-10-CM | POA: Diagnosis not present

## 2020-08-03 MED ORDER — METHYLPREDNISOLONE ACETATE 80 MG/ML IJ SUSP
80.0000 mg | Freq: Once | INTRAMUSCULAR | Status: AC
  Administered 2020-08-03: 80 mg

## 2020-08-03 NOTE — Progress Notes (Signed)
Pt state lower back pain that travels down her left leg. Pt state when she has to bend over makes the pain worse. Pt state she take pain meds to ease the pain.    Numeric Pain Rating Scale and Functional Assessment Average Pain 2   In the last MONTH (on 0-10 scale) has pain interfered with the following?  1. General activity like being  able to carry out your everyday physical activities such as walking, climbing stairs, carrying groceries, or moving a chair?  Rating(10)   +Driver, -BT, -Dye Allergies.

## 2020-08-31 ENCOUNTER — Other Ambulatory Visit: Payer: Self-pay | Admitting: Family

## 2020-08-31 DIAGNOSIS — J301 Allergic rhinitis due to pollen: Secondary | ICD-10-CM

## 2020-09-01 ENCOUNTER — Other Ambulatory Visit: Payer: Self-pay

## 2020-09-01 ENCOUNTER — Ambulatory Visit (INDEPENDENT_AMBULATORY_CARE_PROVIDER_SITE_OTHER): Payer: Medicare HMO

## 2020-09-01 DIAGNOSIS — Z23 Encounter for immunization: Secondary | ICD-10-CM

## 2020-09-01 MED ORDER — PANTOPRAZOLE SODIUM 40 MG PO TBEC: 40.0000 mg | DELAYED_RELEASE_TABLET | Freq: Every day | ORAL | 0 refills | Status: DC

## 2020-09-04 NOTE — Progress Notes (Signed)
Ailanie Ruttan - 86 y.o. female MRN [id number]  Date of birth: [birth year]  Office Visit Note: Visit Date: 08/03/2020 PCP: [name], FNP Referred by: [name], FNP  Subjective: Chief Complaint  Patient presents with  . Lower Back - Pain  . Left Leg - Pain   HPI:  Suzanna Zahn is a 86 y.o. female who comes in today at the request of Dr. Annell Greening for planned Left L4-L5 Lumbar epidural steroid injection with fluoroscopic guidance.  The patient has failed conservative care including home exercise, medications, time and activity modification.  This injection will be diagnostic and hopefully therapeutic.  Please see requesting physician notes for further details and justification.  MRI reviewed with images and spine model.  MRI reviewed in the note below.    ROS Otherwise per HPI.  Assessment & Plan: Visit Diagnoses:  1. Lumbar radiculopathy     Plan: No additional findings.   Meds & Orders:  Meds ordered this encounter  Medications  . methylPREDNISolone acetate (DEPO-MEDROL) injection 80 mg    Orders Placed This Encounter  Procedures  . XR C-ARM NO REPORT  . Epidural Steroid injection    Follow-up: Return for visit to requesting physician as needed.   Procedures: No procedures performed  Lumbosacral Transforaminal Epidural Steroid Injection - Sub-Pedicular Approach with Fluoroscopic Guidance  Patient: Shakeena Kafer      Date of Birth: June 28, 1932 MRN: [id number] PCP: [name], FNP      Visit Date: 08/03/2020   Universal Protocol:    Date/Time: 08/03/2020  Consent Given By: the patient  Position: PRONE  Additional Comments: Vital signs were monitored before and after the procedure. Patient was prepped and draped in the usual sterile fashion. The correct patient, procedure, and site was verified.   Injection Procedure Details:   Procedure diagnoses:  1. Lumbar radiculopathy      Meds Administered:  Meds ordered this encounter    Medications  . methylPREDNISolone acetate (DEPO-MEDROL) injection 80 mg    Laterality: Left  Location/Site:  L4-L5  Needle size: 22 G  Needle type: Spinal  Needle Placement: Transforaminal  Findings:    -Comments: Excellent flow of contrast along the nerve, nerve root and into the epidural space.  Procedure Details: After squaring off the end-plates to get a true AP view, the C-arm was positioned so that an oblique view of the foramen as noted above was visualized. The target area is just inferior to the "nose of the scotty dog" or sub pedicular. The soft tissues overlying this structure were infiltrated with 2-3 ml. of 1% Lidocaine without Epinephrine.  The spinal needle was inserted toward the target using a "trajectory" view along the fluoroscope beam.  Under AP and lateral visualization, the needle was advanced so it did not puncture dura and was located close the 6 O'Clock position of the pedical in AP tracterory. Biplanar projections were used to confirm position. Aspiration was confirmed to be negative for CSF and/or blood. A 1-2 ml. volume of Isovue-250 was injected and flow of contrast was noted at each level. Radiographs were obtained for documentation purposes.   After attaining the desired flow of contrast documented above, a 0.5 to 1.0 ml test dose of 0.25% Marcaine was injected into each respective transforaminal space.  The patient was observed for 90 seconds post injection.  After no sensory deficits were reported, and normal lower extremity motor function was noted,   the above injectate was administered so that equal amounts of  the injectate were placed at each foramen (level) into the transforaminal epidural space.   Additional Comments:  The patient tolerated the procedure well Dressing: 2 x 2 sterile gauze and Band-Aid    Post-procedure details: Patient was observed during the procedure. Post-procedure instructions were reviewed.  Patient left the clinic in  stable condition.      Clinical History: MRI LUMBAR SPINE WITHOUT CONTRAST  TECHNIQUE: Multiplanar, multisequence MR imaging of the lumbar spine was performed. No intravenous contrast was administered.  COMPARISON:  Plain film 05/19/2018.  FINDINGS: Segmentation:  L5 is sacralized.  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  Alignment:  Degenerative anterolisthesis L4 on L5 measures 5 mm.  Vertebrae:  No worrisome osseous lesion.  Endplate reactive changes.  Conus medullaris and cauda equina: Conus extends to the L1 level. Conus and cauda equina appear normal.  Paraspinal and other soft tissues: Negative.  Disc levels:  L1-L2: Disc space narrowing. Central disc extrusion. Mild stenosis. Possible LEFT L2 neural impingement. Disc material does extend into the foramen, but clear-cut L1 neural impingement not established.  L2-L3: Disc space narrowing. Central protrusion. Posterior element hypertrophy. RIGHT greater than LEFT L3 neural impingement.  L3-L4: Disc space narrowing. Central protrusion. Posterior element hypertrophy. Moderate stenosis. BILATERAL L4 neural impingement. Disc material extends into both foramina, and there also and extraforaminal disc herniation on the RIGHT. RIGHT L3 neural impingement is likely.  L4-L5: 5 mm anterolisthesis. Uncovering of the disc with mild bulge. Posterior element hypertrophy. Mild to moderate stenosis. No definite subarticular zone or foraminal zone narrowing.  L5-S1:  Rudimentary disc space.  No impingement.  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  Mild to moderate stenosis at L4-5 due to 5 mm slip, posterior element hypertrophy, and uncovering of the disc with mild bulge. No definite subarticular zone or foraminal zone narrowing. Consider lateral standing flexion extension lumbar radiographs to evaluate for dynamic instability.  Moderate stenosis at L3-4 related to disc space narrowing, central protrusion, and  posterior element hypertrophy. BILATERAL L4 neural impingement in the canal, with additional RIGHT L3 neural impingement likely due to extraforaminal disc herniation.  Central protrusion at L2-3, RIGHT greater than LEFT L3 neural impingement.  Central extrusion L1-2, possible LEFT L2 neural impingement.   Electronically Signed   By: Elsie Stain M.D.   On: 05/26/2018 14:38     Objective:  VS:  HT:    WT:   BMI:     BP:(!) 167/68  HR:(!) 56bpm  TEMP: ( )  RESP:  Physical Exam Constitutional:      General: She is not in acute distress.    Appearance: Normal appearance. She is not ill-appearing.  HENT:     Head: Normocephalic and atraumatic.     Right Ear: External ear normal.     Left Ear: External ear normal.  Eyes:     Extraocular Movements: Extraocular movements intact.  Cardiovascular:     Rate and Rhythm: Normal rate.     Pulses: Normal pulses.  Musculoskeletal:     Right lower leg: No edema.     Left lower leg: No edema.     Comments: Patient has good distal strength with no pain over the greater trochanters.  No clonus or focal weakness.  Skin:    Findings: No erythema, lesion or rash.  Neurological:     General: No focal deficit present.     Mental Status: She is alert and oriented to person, place, and time.     Sensory: No sensory deficit.  Motor: No weakness or abnormal muscle tone.     Coordination: Coordination normal.  Psychiatric:        Mood and Affect: Mood normal.        Behavior: Behavior normal.      Imaging: No results found.

## 2020-09-04 NOTE — Procedures (Signed)
Lumbosacral Transforaminal Epidural Steroid Injection - Sub-Pedicular Approach with Fluoroscopic Guidance  Patient: Carolyn Parsons      Date of Birth: 1931-12-17 MRN: [id number] PCP: [name], FNP      Visit Date: 08/03/2020   Universal Protocol:    Date/Time: 08/03/2020  Consent Given By: the patient  Position: PRONE  Additional Comments: Vital signs were monitored before and after the procedure. Patient was prepped and draped in the usual sterile fashion. The correct patient, procedure, and site was verified.   Injection Procedure Details:   Procedure diagnoses:  1. Lumbar radiculopathy      Meds Administered:  Meds ordered this encounter  Medications  . methylPREDNISolone acetate (DEPO-MEDROL) injection 80 mg    Laterality: Left  Location/Site:  L4-L5  Needle size: 22 G  Needle type: Spinal  Needle Placement: Transforaminal  Findings:    -Comments: Excellent flow of contrast along the nerve, nerve root and into the epidural space.  Procedure Details: After squaring off the end-plates to get a true AP view, the C-arm was positioned so that an oblique view of the foramen as noted above was visualized. The target area is just inferior to the "nose of the scotty dog" or sub pedicular. The soft tissues overlying this structure were infiltrated with 2-3 ml. of 1% Lidocaine without Epinephrine.  The spinal needle was inserted toward the target using a "trajectory" view along the fluoroscope beam.  Under AP and lateral visualization, the needle was advanced so it did not puncture dura and was located close the 6 O'Clock position of the pedical in AP tracterory. Biplanar projections were used to confirm position. Aspiration was confirmed to be negative for CSF and/or blood. A 1-2 ml. volume of Isovue-250 was injected and flow of contrast was noted at each level. Radiographs were obtained for documentation purposes.   After attaining the desired flow of contrast  documented above, a 0.5 to 1.0 ml test dose of 0.25% Marcaine was injected into each respective transforaminal space.  The patient was observed for 90 seconds post injection.  After no sensory deficits were reported, and normal lower extremity motor function was noted,   the above injectate was administered so that equal amounts of the injectate were placed at each foramen (level) into the transforaminal epidural space.   Additional Comments:  The patient tolerated the procedure well Dressing: 2 x 2 sterile gauze and Band-Aid    Post-procedure details: Patient was observed during the procedure. Post-procedure instructions were reviewed.  Patient left the clinic in stable condition.

## 2020-10-19 ENCOUNTER — Other Ambulatory Visit: Payer: Self-pay

## 2020-10-19 ENCOUNTER — Ambulatory Visit (INDEPENDENT_AMBULATORY_CARE_PROVIDER_SITE_OTHER): Payer: Medicare HMO

## 2020-10-19 DIAGNOSIS — Z23 Encounter for immunization: Secondary | ICD-10-CM | POA: Diagnosis not present

## 2020-11-07 ENCOUNTER — Other Ambulatory Visit: Payer: Self-pay | Admitting: Family

## 2020-11-17 ENCOUNTER — Other Ambulatory Visit: Payer: Self-pay

## 2020-11-17 ENCOUNTER — Encounter: Payer: Self-pay | Admitting: Family

## 2020-11-17 ENCOUNTER — Ambulatory Visit: Payer: Medicare HMO | Admitting: Family

## 2020-11-17 VITALS — BP 159/67 | HR 59 | Temp 97.6°F | Ht 59.0 in | Wt 172.0 lb

## 2020-11-17 DIAGNOSIS — I700 Atherosclerosis of aorta: Secondary | ICD-10-CM

## 2020-11-17 DIAGNOSIS — E039 Hypothyroidism, unspecified: Secondary | ICD-10-CM | POA: Diagnosis not present

## 2020-11-17 DIAGNOSIS — E669 Obesity, unspecified: Secondary | ICD-10-CM

## 2020-11-17 DIAGNOSIS — I10 Essential (primary) hypertension: Secondary | ICD-10-CM | POA: Diagnosis not present

## 2020-11-17 DIAGNOSIS — J301 Allergic rhinitis due to pollen: Secondary | ICD-10-CM | POA: Diagnosis not present

## 2020-11-17 DIAGNOSIS — E785 Hyperlipidemia, unspecified: Secondary | ICD-10-CM

## 2020-11-17 DIAGNOSIS — K219 Gastro-esophageal reflux disease without esophagitis: Secondary | ICD-10-CM

## 2020-11-17 MED ORDER — METOPROLOL SUCCINATE ER 25 MG PO TB24: 25.0000 mg | ORAL_TABLET | Freq: Every day | ORAL | 3 refills | Status: DC

## 2020-11-17 MED ORDER — FLUTICASONE PROPIONATE 50 MCG/ACT NA SUSP
NASAL | 0 refills | Status: DC
Start: 2020-11-17 — End: 2021-01-24

## 2020-11-17 MED ORDER — LEVOTHYROXINE SODIUM 25 MCG PO TABS: ORAL_TABLET | ORAL | 3 refills | Status: DC

## 2020-11-17 MED ORDER — ROSUVASTATIN CALCIUM 20 MG PO TABS: 20.0000 mg | ORAL_TABLET | Freq: Every day | ORAL | 3 refills | Status: DC

## 2020-11-17 MED ORDER — PANTOPRAZOLE SODIUM 40 MG PO TBEC: 40.0000 mg | DELAYED_RELEASE_TABLET | Freq: Every day | ORAL | 0 refills | Status: DC

## 2020-11-17 MED ORDER — FEXOFENADINE HCL 180 MG PO TABS: 180.0000 mg | ORAL_TABLET | Freq: Every day | ORAL | 2 refills | Status: DC

## 2020-11-17 MED ORDER — COMBIGAN 0.2-0.5 % OP SOLN: OPHTHALMIC | 3 refills | Status: DC

## 2020-11-17 MED ORDER — AMLODIPINE BESYLATE 5 MG PO TABS: 5.0000 mg | ORAL_TABLET | Freq: Every day | ORAL | 3 refills | Status: DC

## 2020-11-17 NOTE — Progress Notes (Signed)
Subjective:    Patient ID: Carolyn Parsons, female    DOB: 07/14/1932, 87 y.o.   MRN: [id number]  Pt presents to the office today for chronic follow up. Hypertension This is a chronic problem. The current episode started more than 1 year ago. The problem has been waxing and waning since onset. The problem is uncontrolled. Associated symptoms include malaise/fatigue and shortness of breath ("When walking"). Pertinent negatives include no peripheral edema. Risk factors for coronary artery disease include dyslipidemia, obesity and sedentary lifestyle. The current treatment provides moderate improvement. There is no history of CVA or heart failure. Identifiable causes of hypertension include a thyroid problem.  Gastroesophageal Reflux She complains of belching, coughing and heartburn. This is a chronic problem. The current episode started more than 1 year ago. The problem occurs rarely. The problem has been waxing and waning. Associated symptoms include fatigue. Risk factors include obesity. She has tried a PPI for the symptoms. The treatment provided moderate relief.  Thyroid Problem Presents for follow-up visit. Symptoms include dry skin and fatigue. Patient reports no constipation, diarrhea or leg swelling. The symptoms have been stable. Her past medical history is significant for hyperlipidemia. There is no history of heart failure.  Hyperlipidemia This is a chronic problem. The current episode started more than 1 year ago. The problem is controlled. Recent lipid tests were reviewed and are normal. Associated symptoms include shortness of breath ("When walking"). Current antihyperlipidemic treatment includes statins. The current treatment provides moderate improvement of lipids. Risk factors for coronary artery disease include dyslipidemia, hypertension and a sedentary lifestyle.      Review of Systems  Constitutional: Positive for fatigue and malaise/fatigue.  Respiratory: Positive for cough and  shortness of breath ("When walking").   Gastrointestinal: Positive for heartburn. Negative for constipation and diarrhea.  All other systems reviewed and are negative.      Objective:   Physical Exam Vitals reviewed.  Constitutional:      General: She is not in acute distress.    Appearance: She is well-developed and well-nourished.  HENT:     Head: Normocephalic and atraumatic.     Right Ear: Tympanic membrane normal.     Left Ear: Tympanic membrane normal.     Mouth/Throat:     Mouth: Oropharynx is clear and moist.  Eyes:     Pupils: Pupils are equal, round, and reactive to light.  Neck:     Thyroid: No thyromegaly.  Cardiovascular:     Rate and Rhythm: Normal rate and regular rhythm.     Pulses: Intact distal pulses.     Heart sounds: Normal heart sounds. No murmur heard.   Pulmonary:     Effort: Pulmonary effort is normal. No respiratory distress.     Breath sounds: Normal breath sounds. No wheezing.  Abdominal:     General: Bowel sounds are normal. There is no distension.     Palpations: Abdomen is soft.     Tenderness: There is no abdominal tenderness.  Musculoskeletal:        General: No tenderness or edema. Normal range of motion.     Cervical back: Normal range of motion and neck supple.  Skin:    General: Skin is warm and dry.  Neurological:     Mental Status: She is alert and oriented to person, place, and time.     Cranial Nerves: No cranial nerve deficit.     Deep Tendon Reflexes: Reflexes are normal and symmetric.  Psychiatric:  Mood and Affect: Mood and affect normal.        Behavior: Behavior normal.        Thought Content: Thought content normal.        Judgment: Judgment normal.       BP (!) 188/72   Pulse 63   Temp 97.6 F (36.4 C)   Ht '4\' 11"'  (1.499 m)   Wt 172 lb (78 kg)   SpO2 94%   BMI 34.74 kg/m      Assessment & Plan:  [name] comes in today with chief complaint of Medical Management of Chronic Issues   Diagnosis  and orders addressed:  1. Essential hypertension - amLODipine (NORVASC) 5 MG tablet; Take 1 tablet (5 mg total) by mouth daily.  Dispense: 90 tablet; Refill: 3 - metoprolol succinate (TOPROL-XL) 25 MG 24 hr tablet; Take 1 tablet (25 mg total) by mouth daily.  Dispense: 90 tablet; Refill: 3 - CMP14+EGFR - CBC with Differential/Platelet  2. Allergic rhinitis due to pollen, unspecified seasonality - fluticasone (FLONASE) 50 MCG/ACT nasal spray; USE 2 SPRAYS IN BOTH  NOSTRILS DAILY  Dispense: 48 g; Refill: 0 - CMP14+EGFR - CBC with Differential/Platelet - fexofenadine (ALLEGRA) 180 MG tablet; Take 1 tablet (180 mg total) by mouth daily.  Dispense: 90 tablet; Refill: 2  3. Hypothyroidism, unspecified type - levothyroxine (SYNTHROID) 25 MCG tablet; TAKE 1 TABLET BY MOUTH  DAILY BEFORE BREAKFAST  Dispense: 90 tablet; Refill: 3 - CMP14+EGFR - CBC with Differential/Platelet - TSH  4. Hyperlipidemia, unspecified hyperlipidemia type - rosuvastatin (CRESTOR) 20 MG tablet; Take 1 tablet (20 mg total) by mouth daily.  Dispense: 90 tablet; Refill: 3 - CMP14+EGFR - CBC with Differential/Platelet  5. Primary hypertension - CMP14+EGFR - CBC with Differential/Platelet  6. Aortic atherosclerosis (HCC) - CMP14+EGFR - CBC with Differential/Platelet  7. Gastroesophageal reflux disease, unspecified whether esophagitis present - pantoprazole (PROTONIX) 40 MG tablet; Take 1 tablet (40 mg total) by mouth daily.  Dispense: 90 tablet; Refill: 0 - CMP14+EGFR - CBC with Differential/Platelet  8. Obesity (BMI 30.0-34.9) - CMP14+EGFR - CBC with Differential/Platelet   Labs pending Health Maintenance reviewed Diet and exercise encouraged  Follow up plan: 6 months    Evelina Dun, FNP

## 2020-11-17 NOTE — Patient Instructions (Signed)

## 2020-11-18 LAB — CMP14+EGFR
ALT: 10 IU/L (ref 0–32)
AST: 17 IU/L (ref 0–40)
Albumin/Globulin Ratio: 1.8 (ref 1.2–2.2)
Albumin: 4.5 g/dL (ref 3.6–4.6)
Alkaline Phosphatase: 81 IU/L (ref 44–121)
BUN/Creatinine Ratio: 21 (ref 12–28)
BUN: 18 mg/dL (ref 8–27)
Bilirubin Total: 0.5 mg/dL (ref 0.0–1.2)
CO2: 24 mmol/L (ref 20–29)
Calcium: 9.3 mg/dL (ref 8.7–10.3)
Chloride: 103 mmol/L (ref 96–106)
Creatinine, Ser: 0.87 mg/dL (ref 0.57–1.00)
GFR calc Af Amer: 70 mL/min/{1.73_m2} (ref >59)
GFR calc non Af Amer: 61 mL/min/{1.73_m2} (ref >59)
Globulin, Total: 2.5 g/dL (ref 1.5–4.5)
Glucose: 106 mg/dL — ABNORMAL HIGH (ref 65–99)
Potassium: 4.3 mmol/L (ref 3.5–5.2)
Sodium: 140 mmol/L (ref 134–144)
Total Protein: 7 g/dL (ref 6.0–8.5)

## 2020-11-18 LAB — CBC WITH DIFFERENTIAL/PLATELET
Basophils Absolute: 0 10*3/uL (ref 0.0–0.2)
Basos: 0 %
EOS (ABSOLUTE): 0.2 10*3/uL (ref 0.0–0.4)
Eos: 3 %
Hematocrit: 45.7 % (ref 34.0–46.6)
Hemoglobin: 15.1 g/dL (ref 11.1–15.9)
Immature Grans (Abs): 0 10*3/uL (ref 0.0–0.1)
Immature Granulocytes: 0 %
Lymphocytes Absolute: 2.2 10*3/uL (ref 0.7–3.1)
Lymphs: 29 %
MCH: 30.2 pg (ref 26.6–33.0)
MCHC: 33 g/dL (ref 31.5–35.7)
MCV: 91 fL (ref 79–97)
Monocytes Absolute: 0.5 10*3/uL (ref 0.1–0.9)
Monocytes: 7 %
Neutrophils Absolute: 4.5 10*3/uL (ref 1.4–7.0)
Neutrophils: 61 %
Platelets: 185 10*3/uL (ref 150–450)
RBC: 5 x10E6/uL (ref 3.77–5.28)
RDW: 12.1 % (ref 11.7–15.4)
WBC: 7.4 10*3/uL (ref 3.4–10.8)

## 2020-11-18 LAB — TSH: TSH: 3.86 u[IU]/mL (ref 0.450–4.500)

## 2020-11-25 ENCOUNTER — Ambulatory Visit: Payer: Medicare HMO | Admitting: Family

## 2021-01-24 ENCOUNTER — Other Ambulatory Visit: Payer: Self-pay | Admitting: Family

## 2021-01-24 DIAGNOSIS — J301 Allergic rhinitis due to pollen: Secondary | ICD-10-CM

## 2021-05-19 ENCOUNTER — Other Ambulatory Visit: Payer: Self-pay

## 2021-05-19 ENCOUNTER — Encounter: Payer: Self-pay | Admitting: Family

## 2021-05-19 ENCOUNTER — Ambulatory Visit: Payer: Medicare HMO | Admitting: Family

## 2021-05-19 VITALS — BP 174/68 | HR 60

## 2021-05-19 DIAGNOSIS — I10 Essential (primary) hypertension: Secondary | ICD-10-CM

## 2021-05-19 DIAGNOSIS — J301 Allergic rhinitis due to pollen: Secondary | ICD-10-CM | POA: Diagnosis not present

## 2021-05-19 DIAGNOSIS — K219 Gastro-esophageal reflux disease without esophagitis: Secondary | ICD-10-CM | POA: Diagnosis not present

## 2021-05-19 DIAGNOSIS — Z23 Encounter for immunization: Secondary | ICD-10-CM

## 2021-05-19 DIAGNOSIS — E785 Hyperlipidemia, unspecified: Secondary | ICD-10-CM

## 2021-05-19 DIAGNOSIS — I700 Atherosclerosis of aorta: Secondary | ICD-10-CM

## 2021-05-19 DIAGNOSIS — E669 Obesity, unspecified: Secondary | ICD-10-CM

## 2021-05-19 DIAGNOSIS — E039 Hypothyroidism, unspecified: Secondary | ICD-10-CM

## 2021-05-19 DIAGNOSIS — H9190 Unspecified hearing loss, unspecified ear: Secondary | ICD-10-CM

## 2021-05-19 DIAGNOSIS — E66811 Obesity, class 1: Secondary | ICD-10-CM

## 2021-05-19 MED ORDER — PANTOPRAZOLE SODIUM 40 MG PO TBEC: 40.0000 mg | DELAYED_RELEASE_TABLET | Freq: Every day | ORAL | 3 refills | Status: DC

## 2021-05-19 MED ORDER — LEVOTHYROXINE SODIUM 25 MCG PO TABS
ORAL_TABLET | ORAL | 3 refills | Status: DC
Start: 2021-05-19 — End: 2021-05-22

## 2021-05-19 MED ORDER — AMLODIPINE BESYLATE 5 MG PO TABS: 5.0000 mg | ORAL_TABLET | Freq: Every day | ORAL | 3 refills | Status: DC

## 2021-05-19 MED ORDER — METOPROLOL SUCCINATE ER 25 MG PO TB24: 25.0000 mg | ORAL_TABLET | Freq: Every day | ORAL | 3 refills | Status: DC

## 2021-05-19 MED ORDER — FLUTICASONE PROPIONATE 50 MCG/ACT NA SUSP: 1.0000 | Freq: Every day | NASAL | 3 refills | Status: DC

## 2021-05-19 MED ORDER — ROSUVASTATIN CALCIUM 20 MG PO TABS: 20.0000 mg | ORAL_TABLET | Freq: Every day | ORAL | 3 refills | Status: DC

## 2021-05-19 NOTE — Progress Notes (Signed)
Subjective:    Patient ID: Carolyn Parsons, female    DOB: [birth year], 87 y.o.   MRN: [id number]  Chief Complaint  Patient presents with   Medical Management of Chronic Issues   Pt presents to the office today for chronic follow up. She has seen chronic back pain, she has seen Pain management for injections that has helped in the past.  Hypertension This is a chronic problem. The current episode started more than 1 year ago. The problem has been waxing and waning since onset. The problem is uncontrolled. Associated symptoms include malaise/fatigue. Pertinent negatives include no peripheral edema or shortness of breath. Risk factors for coronary artery disease include dyslipidemia, obesity and sedentary lifestyle. The current treatment provides moderate improvement. Identifiable causes of hypertension include a thyroid problem.  Gastroesophageal Reflux She complains of belching and heartburn. This is a chronic problem. The current episode started more than 1 year ago. The problem occurs occasionally. The problem has been waxing and waning. Associated symptoms include fatigue. Risk factors include obesity. She has tried a PPI for the symptoms. The treatment provided moderate relief.  Thyroid Problem Presents for follow-up visit. Symptoms include fatigue. Patient reports no constipation or diarrhea. The symptoms have been stable. Her past medical history is significant for hyperlipidemia.  Hyperlipidemia This is a chronic problem. The current episode started more than 1 year ago. The problem is controlled. Recent lipid tests were reviewed and are normal. Exacerbating diseases include obesity. Pertinent negatives include no shortness of breath. Current antihyperlipidemic treatment includes statins. The current treatment provides moderate improvement of lipids. Risk factors for coronary artery disease include dyslipidemia, diabetes mellitus, hypertension, a sedentary lifestyle and post-menopausal.  Back  Pain This is a chronic problem. The current episode started more than 1 year ago. The problem occurs intermittently. The pain is present in the lumbar spine. The quality of the pain is described as aching. The pain is moderate. Risk factors include obesity. She has tried bed rest for the symptoms. The treatment provided mild relief.     Review of Systems  Constitutional:  Positive for fatigue and malaise/fatigue.  Respiratory:  Negative for shortness of breath.   Gastrointestinal:  Positive for heartburn. Negative for constipation and diarrhea.  Musculoskeletal:  Positive for back pain.  All other systems reviewed and are negative.     Objective:   Physical Exam Vitals reviewed.  Constitutional:      General: She is not in acute distress.    Appearance: She is well-developed.  HENT:     Head: Normocephalic and atraumatic.     Right Ear: Tympanic membrane normal.     Left Ear: Tympanic membrane normal.  Eyes:     Pupils: Pupils are equal, round, and reactive to light.  Neck:     Thyroid: No thyromegaly.  Cardiovascular:     Rate and Rhythm: Normal rate and regular rhythm.     Heart sounds: Normal heart sounds. No murmur heard. Pulmonary:     Effort: Pulmonary effort is normal. No respiratory distress.     Breath sounds: Normal breath sounds. No wheezing.  Abdominal:     General: Bowel sounds are normal. There is no distension.     Palpations: Abdomen is soft.     Tenderness: There is no abdominal tenderness.  Musculoskeletal:        General: No tenderness. Normal range of motion.     Cervical back: Normal range of motion and neck supple.  Skin:    General: Skin  is warm and dry.  Neurological:     Mental Status: She is alert and oriented to person, place, and time.     Cranial Nerves: No cranial nerve deficit.     Deep Tendon Reflexes: Reflexes are normal and symmetric.  Psychiatric:        Behavior: Behavior normal.        Thought Content: Thought content normal.         Judgment: Judgment normal.      BP (!) 174/68   Pulse 60      Assessment & Plan:  Marieclaire Bettenhausen comes in today with chief complaint of Medical Management of Chronic Issues   Diagnosis and orders addressed:  1. Allergic rhinitis due to pollen, unspecified seasonality - fluticasone (FLONASE) 50 MCG/ACT nasal spray; Place 1 spray into both nostrils daily.  Dispense: 48 g; Refill: 3 - CMP14+EGFR - CBC with Differential/Platelet  2. Essential hypertension Pt to monitor at home. Pt is irritated today and will hold off on medication changes.  - metoprolol succinate (TOPROL-XL) 25 MG 24 hr tablet; Take 1 tablet (25 mg total) by mouth daily.  Dispense: 90 tablet; Refill: 3 - amLODipine (NORVASC) 5 MG tablet; Take 1 tablet (5 mg total) by mouth daily.  Dispense: 90 tablet; Refill: 3 - CMP14+EGFR - CBC with Differential/Platelet  3. Gastroesophageal reflux disease, unspecified whether esophagitis present - pantoprazole (PROTONIX) 40 MG tablet; Take 1 tablet (40 mg total) by mouth daily.  Dispense: 90 tablet; Refill: 3 - CMP14+EGFR - CBC with Differential/Platelet  4. Hyperlipidemia, unspecified hyperlipidemia type - rosuvastatin (CRESTOR) 20 MG tablet; Take 1 tablet (20 mg total) by mouth daily.  Dispense: 90 tablet; Refill: 3 - CMP14+EGFR - CBC with Differential/Platelet  5. Hypothyroidism, unspecified type - levothyroxine (SYNTHROID) 25 MCG tablet; TAKE 1 TABLET BY MOUTH  DAILY BEFORE BREAKFAST  Dispense: 90 tablet; Refill: 3 - CMP14+EGFR - CBC with Differential/Platelet - TSH  6. Primary hypertension - CMP14+EGFR - CBC with Differential/Platelet  7. Aortic atherosclerosis (HCC) - CMP14+EGFR - CBC with Differential/Platelet  8. Obesity (BMI 30.0-34.9) - CMP14+EGFR - CBC with Differential/Platelet  9. Hearing loss, unspecified hearing loss type, unspecified laterality - Ambulatory referral to Audiology - CMP14+EGFR - CBC with Differential/Platelet   Labs  pending Health Maintenance reviewed Diet and exercise encouraged  Follow up plan: 6 months   Evelina Dun, FNP

## 2021-05-19 NOTE — Patient Instructions (Addendum)
Hypertension, Adult High blood pressure (hypertension) is when the force of blood pumping through the arteries is too strong. The arteries are the blood vessels that carry blood from the heart throughout the body. Hypertension forces the heart to work harder to pump blood and may cause arteries to become narrow or stiff. Untreated or uncontrolled hypertension can cause a heart attack, heart failure, a stroke, kidney disease, and otherproblems. A blood pressure reading consists of a higher number over a lower number. Ideally, your blood pressure should be below 120/80. The first ("top") number is called the systolic pressure. It is a measure of the pressure in your arteries as your heart beats. The second ("bottom") number is called the diastolic pressure. It is a measure of the pressure in your arteries as theheart relaxes. What are the causes? The exact cause of this condition is not known. There are some conditions thatresult in or are related to high blood pressure. What increases the risk? Some risk factors for high blood pressure are under your control. The following factors may make you more likely to develop this condition: Smoking. Having type 2 diabetes mellitus, high cholesterol, or both. Not getting enough exercise or physical activity. Being overweight. Having too much fat, sugar, calories, or salt (sodium) in your diet. Drinking too much alcohol. Some risk factors for high blood pressure may be difficult or impossible to change. Some of these factors include: Having chronic kidney disease. Having a family history of high blood pressure. Age. Risk increases with age. Race. You may be at higher risk if you are African American. Gender. Men are at higher risk than women before age 45. After age 65, women are at higher risk than men. Having obstructive sleep apnea. Stress. What are the signs or symptoms? High blood pressure may not cause symptoms. Very high blood pressure (hypertensive  crisis) may cause: Headache. Anxiety. Shortness of breath. Nosebleed. Nausea and vomiting. Vision changes. Severe chest pain. Seizures. How is this diagnosed? This condition is diagnosed by measuring your blood pressure while you are seated, with your arm resting on a flat surface, your legs uncrossed, and your feet flat on the floor. The cuff of the blood pressure monitor will be placed directly against the skin of your upper arm at the level of your heart. It should be measured at least twice using the same arm. Certain conditions cancause a difference in blood pressure between your right and left arms. Certain factors can cause blood pressure readings to be lower or higher than normal for a short period of time: When your blood pressure is higher when you are in a health care provider's office than when you are at home, this is called white coat hypertension. Most people with this condition do not need medicines. When your blood pressure is higher at home than when you are in a health care provider's office, this is called masked hypertension. Most people with this condition may need medicines to control blood pressure. If you have a high blood pressure reading during one visit or you have normal blood pressure with other risk factors, you may be asked to: Return on a different day to have your blood pressure checked again. Monitor your blood pressure at home for 1 week or longer. If you are diagnosed with hypertension, you may have other blood or imaging tests to help your health care provider understand your overall risk for otherconditions. How is this treated? This condition is treated by making healthy lifestyle changes, such as   eating healthy foods, exercising more, and reducing your alcohol intake. Your health care provider may prescribe medicine if lifestyle changes are not enough to get your blood pressure under control, and if: Your systolic blood pressure is above 130. Your  diastolic blood pressure is above 80. Your personal target blood pressure may vary depending on your medicalconditions, your age, and other factors. Follow these instructions at home: Eating and drinking  Eat a diet that is high in fiber and potassium, and low in sodium, added sugar, and fat. An example eating plan is called the DASH (Dietary Approaches to Stop Hypertension) diet. To eat this way: Eat plenty of fresh fruits and vegetables. Try to fill one half of your plate at each meal with fruits and vegetables. Eat whole grains, such as whole-wheat pasta, brown rice, or whole-grain bread. Fill about one fourth of your plate with whole grains. Eat or drink low-fat dairy products, such as skim milk or low-fat yogurt. Avoid fatty cuts of meat, processed or cured meats, and poultry with skin. Fill about one fourth of your plate with lean proteins, such as fish, chicken without skin, beans, eggs, or tofu. Avoid pre-made and processed foods. These tend to be higher in sodium, added sugar, and fat. Reduce your daily sodium intake. Most people with hypertension should eat less than 1,500 mg of sodium a day. Do not drink alcohol if: Your health care provider tells you not to drink. You are pregnant, may be pregnant, or are planning to become pregnant. If you drink alcohol: Limit how much you use to: 0-1 drink a day for women. 0-2 drinks a day for men. Be aware of how much alcohol is in your drink. In the [place] one drink equals one 12 oz bottle of beer (355 mL), one 5 oz glass of wine (148 mL), or one 1 oz glass of hard liquor (44 mL).  Lifestyle  Work with your health care provider to maintain a healthy body weight or to lose weight. Ask what an ideal weight is for you. Get at least 30 minutes of exercise most days of the week. Activities may include walking, swimming, or biking. Include exercise to strengthen your muscles (resistance exercise), such as Pilates or lifting weights, as part of your  weekly exercise routine. Try to do these types of exercises for 30 minutes at least 3 days a week. Do not use any products that contain nicotine or tobacco, such as cigarettes, e-cigarettes, and chewing tobacco. If you need help quitting, ask your health care provider. Monitor your blood pressure at home as told by your health care provider. Keep all follow-up visits as told by your health care provider. This is important.  Medicines Take over-the-counter and prescription medicines only as told by your health care provider. Follow directions carefully. Blood pressure medicines must be taken as prescribed. Do not skip doses of blood pressure medicine. Doing this puts you at risk for problems and can make the medicine less effective. Ask your health care provider about side effects or reactions to medicines that you should watch for. Contact a health care provider if you: Think you are having a reaction to a medicine you are taking. Have headaches that keep coming back (recurring). Feel dizzy. Have swelling in your ankles. Have trouble with your vision. Get help right away if you: Develop a severe headache or confusion. Have unusual weakness or numbness. Feel faint. Have severe pain in your chest or abdomen. Vomit repeatedly. Have trouble breathing. Summary Hypertension is  when the force of blood pumping through your arteries is too strong. If this condition is not controlled, it may put you at risk for serious complications. Your personal target blood pressure may vary depending on your medical conditions, your age, and other factors. For most people, a normal blood pressure is less than 120/80. Hypertension is treated with lifestyle changes, medicines, or a combination of both. Lifestyle changes include losing weight, eating a healthy, low-sodium diet, exercising more, and limiting alcohol. This information is not intended to replace advice given to you by your health care provider. Make  sure you discuss any questions you have with your healthcare provider. Document Revised: 06/25/2018 Document Reviewed: 06/25/2018 Elsevier Patient Education  2022 [employer] Inc. https://doi.[email or web address]">  Chronic Back Pain When back pain lasts longer than 3 months, it is called chronic back pain. The cause of your back pain may not be known. Some common causes include: Wear and tear (degenerative disease) of the bones, ligaments, or disks in your back. Inflammation and stiffness in your back (arthritis). People who have chronic back pain often go through certain periods in which the pain is more intense (flare-ups). Many people can learn to manage the pain with home care. Follow these instructions at home: Pay attention to any changes in your symptoms. Take these actions to help withyour pain: Managing pain and stiffness     If directed, apply ice to the painful area. Your health care provider may recommend applying ice during the first 24-48 hours after a flare-up begins. To do this: Put ice in a plastic bag. Place a towel between your skin and the bag. Leave the ice on for 20 minutes, 2-3 times per day. If directed, apply heat to the affected area as often as told by your health care provider. Use the heat source that your health care provider recommends, such as a moist heat pack or a heating pad. Place a towel between your skin and the heat source. Leave the heat on for 20-30 minutes. Remove the heat if your skin turns bright red. This is especially important if you are unable to feel pain, heat, or cold. You may have a greater risk of getting burned. Try soaking in a warm tub. Activity  Avoid bending and other activities that make the problem worse. Maintain a proper position when standing or sitting: When standing, keep your upper back and neck straight, with your shoulders pulled back. Avoid slouching. When sitting, keep your back straight and relax your shoulders.  Do not round your shoulders or pull them backward. Do not sit or stand in one place for long periods of time. Take brief periods of rest throughout the day. This will reduce your pain. Resting in a lying or standing position is usually better than sitting to rest. When you are resting for longer periods, mix in some mild activity or stretching between periods of rest. This will help to prevent stiffness and pain. Get regular exercise. Ask your health care provider what activities are safe for you. Do not lift anything that is heavier than 10 lb (4.5 kg), or the limit that you are told, until your health care provider says that it is safe. Always use proper lifting technique, which includes: Bending your knees. Keeping the load close to your body. Avoiding twisting. Sleep on a firm mattress in a comfortable position. Try lying on your side with your knees slightly bent. If you lie on your back, put a pillow under your knees.  Medicines Treatment  may include medicines for pain and inflammation taken by mouth or applied to the skin, prescription pain medicine, or muscle relaxants. Take over-the-counter and prescription medicines only as told by your health care provider. Ask your health care provider if the medicine prescribed to you: Requires you to avoid driving or using machinery. Can cause constipation. You may need to take these actions to prevent or treat constipation: Drink enough fluid to keep your urine pale yellow. Take over-the-counter or prescription medicines. Eat foods that are high in fiber, such as beans, whole grains, and fresh fruits and vegetables. Limit foods that are high in fat and processed sugars, such as fried or sweet foods. General instructions Do not use any products that contain nicotine or tobacco, such as cigarettes, e-cigarettes, and chewing tobacco. If you need help quitting, ask your health care provider. Keep all follow-up visits as told by your health care  provider. This is important. Contact a health care provider if: You have pain that is not relieved with rest or medicine. Your pain gets worse, or you have new pain. You have a high fever. You have rapid weight loss. You have trouble doing your normal activities. Get help right away if: You have weakness or numbness in one or both of your legs or feet. You have trouble controlling your bladder or your bowels. You have severe back pain and have any of the following: Nausea or vomiting. Pain in your abdomen. Shortness of breath or you faint. Summary Chronic back pain is back pain that lasts longer than 3 months. When a flare-up begins, apply ice to the painful area for the first 24-48 hours. Apply a moist heat pad or use a heating pad on the painful area as directed by your health care provider. When you are resting for longer periods, mix in some mild activity or stretching between periods of rest. This will help to prevent stiffness and pain. This information is not intended to replace advice given to you by your health care provider. Make sure you discuss any questions you have with your healthcare provider. Document Revised: 11/25/2019 Document Reviewed: 11/25/2019 Elsevier Patient Education  2022 [employer].

## 2021-05-20 LAB — CMP14+EGFR
ALT: 14 IU/L (ref 0–32)
AST: 20 IU/L (ref 0–40)
Albumin/Globulin Ratio: 1.7 (ref 1.2–2.2)
Albumin: 4.4 g/dL (ref 3.6–4.6)
Alkaline Phosphatase: 81 IU/L (ref 44–121)
BUN/Creatinine Ratio: 19 (ref 12–28)
BUN: 17 mg/dL (ref 8–27)
Bilirubin Total: 0.5 mg/dL (ref 0.0–1.2)
CO2: 24 mmol/L (ref 20–29)
Calcium: 9.5 mg/dL (ref 8.7–10.3)
Chloride: 102 mmol/L (ref 96–106)
Creatinine, Ser: 0.91 mg/dL (ref 0.57–1.00)
Globulin, Total: 2.6 g/dL (ref 1.5–4.5)
Glucose: 106 mg/dL — ABNORMAL HIGH (ref 65–99)
Potassium: 5 mmol/L (ref 3.5–5.2)
Sodium: 142 mmol/L (ref 134–144)
Total Protein: 7 g/dL (ref 6.0–8.5)
eGFR: 61 mL/min/{1.73_m2} (ref >59)

## 2021-05-20 LAB — CBC WITH DIFFERENTIAL/PLATELET
Basophils Absolute: 0 10*3/uL (ref 0.0–0.2)
Basos: 0 %
EOS (ABSOLUTE): 0.3 10*3/uL (ref 0.0–0.4)
Eos: 4 %
Hematocrit: 45.1 % (ref 34.0–46.6)
Hemoglobin: 15.2 g/dL (ref 11.1–15.9)
Immature Grans (Abs): 0 10*3/uL (ref 0.0–0.1)
Immature Granulocytes: 0 %
Lymphocytes Absolute: 2.5 10*3/uL (ref 0.7–3.1)
Lymphs: 28 %
MCH: 30.6 pg (ref 26.6–33.0)
MCHC: 33.7 g/dL (ref 31.5–35.7)
MCV: 91 fL (ref 79–97)
Monocytes Absolute: 0.7 10*3/uL (ref 0.1–0.9)
Monocytes: 8 %
Neutrophils Absolute: 5.5 10*3/uL (ref 1.4–7.0)
Neutrophils: 60 %
Platelets: 215 10*3/uL (ref 150–450)
RBC: 4.96 x10E6/uL (ref 3.77–5.28)
RDW: 12.3 % (ref 11.7–15.4)
WBC: 9.1 10*3/uL (ref 3.4–10.8)

## 2021-05-20 LAB — TSH: TSH: 5.23 u[IU]/mL — ABNORMAL HIGH (ref 0.450–4.500)

## 2021-05-22 ENCOUNTER — Other Ambulatory Visit: Payer: Self-pay | Admitting: Family

## 2021-05-22 MED ORDER — LEVOTHYROXINE SODIUM 50 MCG PO TABS: 50.0000 ug | ORAL_TABLET | Freq: Every day | ORAL | 1 refills | Status: DC

## 2021-05-23 ENCOUNTER — Telehealth: Payer: Self-pay | Admitting: Family

## 2021-05-24 ENCOUNTER — Telehealth: Payer: Self-pay | Admitting: Family

## 2021-05-24 DIAGNOSIS — E039 Hypothyroidism, unspecified: Secondary | ICD-10-CM

## 2021-05-24 NOTE — Telephone Encounter (Signed)
Patient just needs to return for labs only in 2 months.  Future orders placed.

## 2021-07-21 ENCOUNTER — Other Ambulatory Visit: Payer: Self-pay

## 2021-07-21 ENCOUNTER — Ambulatory Visit (INDEPENDENT_AMBULATORY_CARE_PROVIDER_SITE_OTHER): Payer: Medicare HMO

## 2021-07-21 ENCOUNTER — Other Ambulatory Visit: Payer: Medicare HMO

## 2021-07-21 DIAGNOSIS — Z23 Encounter for immunization: Secondary | ICD-10-CM

## 2021-07-21 DIAGNOSIS — E039 Hypothyroidism, unspecified: Secondary | ICD-10-CM

## 2021-07-22 LAB — THYROID PANEL WITH TSH
Free Thyroxine Index: 3.4 (ref 1.2–4.9)
T3 Uptake Ratio: 29 % (ref 24–39)
T4, Total: 11.8 ug/dL (ref 4.5–12.0)
TSH: 4.45 u[IU]/mL (ref 0.450–4.500)

## 2021-07-24 ENCOUNTER — Telehealth: Payer: Self-pay | Admitting: Family

## 2021-07-24 ENCOUNTER — Other Ambulatory Visit: Payer: Self-pay | Admitting: Family Medicine

## 2021-07-24 MED ORDER — LEVOTHYROXINE SODIUM 50 MCG PO TABS: 50.0000 ug | ORAL_TABLET | Freq: Every day | ORAL | 1 refills | Status: DC

## 2021-07-24 NOTE — Telephone Encounter (Signed)
Daughter aware.

## 2021-07-24 NOTE — Telephone Encounter (Signed)
Patient aware and verbalized understanding. °

## 2021-09-13 ENCOUNTER — Telehealth: Payer: Self-pay | Admitting: Family

## 2021-09-13 NOTE — Telephone Encounter (Signed)
Spoke with Daughter aware can take to call us is she gets worse so we can make an appointment

## 2021-09-18 ENCOUNTER — Ambulatory Visit (INDEPENDENT_AMBULATORY_CARE_PROVIDER_SITE_OTHER): Payer: Medicare HMO | Admitting: Nurse Practitioner

## 2021-09-18 ENCOUNTER — Encounter: Payer: Self-pay | Admitting: Nurse Practitioner

## 2021-09-18 DIAGNOSIS — J069 Acute upper respiratory infection, unspecified: Secondary | ICD-10-CM | POA: Diagnosis not present

## 2021-09-18 NOTE — Assessment & Plan Note (Signed)
Take meds as prescribed - Use a cool mist humidifier  -Use saline nose sprays frequently -Force fluids -For fever or aches or pains- take Tylenol or ibuprofen. -Patient tested positive for COVID-19 09/17/2021. Follow up with worsening unresolved symptoms

## 2021-09-18 NOTE — Progress Notes (Signed)
   Virtual Visit  Note Due to COVID-19 pandemic this visit was conducted virtually. This visit type was conducted due to national recommendations for restrictions regarding the COVID-19 Pandemic (e.g. social distancing, sheltering in place) in an effort to limit this patient's exposure and mitigate transmission in our community. All issues noted in this document were discussed and addressed.  A physical exam was not performed with this format.  I connected with Carolyn Parsons on 09/18/21 at 12 noon by telephone and verified that I am speaking with the correct person using two identifiers. [name] is currently located at home during visit. The provider, Daryll Drown, NP is located in their office at time of visit.  I discussed the limitations, risks, security and privacy concerns of performing an evaluation and management service by telephone and the availability of in person appointments. I also discussed with the patient that there may be a patient responsible charge related to this service. The patient expressed understanding and agreed to proceed.   History and Present Illness:  URI  This is a new problem. The current episode started yesterday. The problem has been gradually improving. There has been no fever. Associated symptoms include coughing. Pertinent negatives include no abdominal pain, congestion, dysuria, headaches, nausea, sinus pain, sneezing or sore throat. She has tried decongestant for the symptoms. The treatment provided mild relief.     Review of Systems  HENT:  Negative for congestion, sinus pain, sneezing and sore throat.   Respiratory:  Positive for cough.   Gastrointestinal:  Negative for abdominal pain and nausea.  Genitourinary:  Negative for dysuria.  Neurological:  Negative for headaches.    Observations/Objective: Televisit patient not in distress  Assessment and Plan: Take meds as prescribed - Use a cool mist humidifier  -Use saline nose sprays  frequently -Force fluids -For fever or aches or pains- take Tylenol or ibuprofen. -Patient tested positive for COVID-19 09/17/2021. Follow up with worsening unresolved symptoms  -Patient is unable to take Paxlovid  due to current medication  taken for cataract. Follow Up Instructions: Follow-up with worsening unresolved symptoms    I discussed the assessment and treatment plan with the patient. The patient was provided an opportunity to ask questions and all were answered. The patient agreed with the plan and demonstrated an understanding of the instructions.   The patient was advised to call back or seek an in-person evaluation if the symptoms worsen or if the condition fails to improve as anticipated.  The above assessment and management plan was discussed with the patient. The patient verbalized understanding of and has agreed to the management plan. Patient is aware to call the clinic if symptoms persist or worsen. Patient is aware when to return to the clinic for a follow-up visit. Patient educated on when it is appropriate to go to the emergency department.   Time call ended: 12:10 PM  I provided 10 minutes of  non face-to-face time during this encounter.    Daryll Drown, NP

## 2021-10-10 ENCOUNTER — Other Ambulatory Visit: Payer: Self-pay | Admitting: Family

## 2021-12-12 ENCOUNTER — Ambulatory Visit: Payer: Medicare HMO | Admitting: Family

## 2021-12-21 ENCOUNTER — Encounter: Payer: Self-pay | Admitting: Family

## 2021-12-21 ENCOUNTER — Ambulatory Visit (INDEPENDENT_AMBULATORY_CARE_PROVIDER_SITE_OTHER): Payer: Medicare HMO | Admitting: Family

## 2021-12-21 VITALS — BP 155/63 | HR 54 | Temp 97.0°F | Ht 59.0 in | Wt 170.8 lb

## 2021-12-21 DIAGNOSIS — Z23 Encounter for immunization: Secondary | ICD-10-CM

## 2021-12-21 DIAGNOSIS — K219 Gastro-esophageal reflux disease without esophagitis: Secondary | ICD-10-CM

## 2021-12-21 DIAGNOSIS — I10 Essential (primary) hypertension: Secondary | ICD-10-CM

## 2021-12-21 DIAGNOSIS — I700 Atherosclerosis of aorta: Secondary | ICD-10-CM

## 2021-12-21 DIAGNOSIS — E66811 Obesity, class 1: Secondary | ICD-10-CM

## 2021-12-21 DIAGNOSIS — E039 Hypothyroidism, unspecified: Secondary | ICD-10-CM

## 2021-12-21 DIAGNOSIS — E669 Obesity, unspecified: Secondary | ICD-10-CM

## 2021-12-21 DIAGNOSIS — Z0001 Encounter for general adult medical examination with abnormal findings: Secondary | ICD-10-CM | POA: Diagnosis not present

## 2021-12-21 DIAGNOSIS — Z0000 Encounter for general adult medical examination without abnormal findings: Secondary | ICD-10-CM

## 2021-12-21 DIAGNOSIS — E785 Hyperlipidemia, unspecified: Secondary | ICD-10-CM

## 2021-12-21 NOTE — Progress Notes (Signed)
Subjective:    Patient ID: Carolyn Parsons, female    DOB: 1932-10-26, 88 y.o.   MRN: [id number]  Chief Complaint  Patient presents with   Medical Management of Chronic Issues   Pt presents to the office today for chronic follow up. She has seen chronic back pain, she has seen Pain management for injections that has helped in the past.   She aortic atherosclerosis and takes Crestor 20 mg daily.  Hypertension This is a chronic problem. The current episode started more than 1 year ago. The problem has been waxing and waning since onset. The problem is uncontrolled. Pertinent negatives include no malaise/fatigue, peripheral edema or shortness of breath. Risk factors for coronary artery disease include obesity. The current treatment provides moderate improvement. Identifiable causes of hypertension include a thyroid problem. There is no history of pheochromocytoma.  Thyroid Problem Presents for follow-up visit. Symptoms include dry skin. Patient reports no anxiety, depressed mood, fatigue or weight gain. Her past medical history is significant for hyperlipidemia.  Gastroesophageal Reflux She complains of belching and heartburn. This is a chronic problem. The current episode started more than 1 year ago. The problem occurs occasionally. Pertinent negatives include no fatigue. Risk factors include obesity. She has tried a PPI for the symptoms. The treatment provided mild relief.  Hyperlipidemia This is a chronic problem. The current episode started more than 1 year ago. The problem is controlled. Recent lipid tests were reviewed and are normal. Exacerbating diseases include obesity. Pertinent negatives include no shortness of breath. Current antihyperlipidemic treatment includes statins. The current treatment provides moderate improvement of lipids. Risk factors for coronary artery disease include dyslipidemia, hypertension, a sedentary lifestyle and post-menopausal.     Review of Systems   Constitutional:  Negative for fatigue, malaise/fatigue and weight gain.  Respiratory:  Negative for shortness of breath.   Gastrointestinal:  Positive for heartburn.  Psychiatric/Behavioral:  The patient is not nervous/anxious.   All other systems reviewed and are negative.  Family History  Problem Relation Age of Onset   Cancer Mother    Cancer Father        Brain    Heart disease Sister    Leukemia Sister    Cancer Brother        Prostate   Hyperlipidemia Daughter    Hypertension Daughter    Heart disease Son    Dementia Sister    Heart disease Son    Hyperlipidemia Son    Hypertension Son    Social History   Socioeconomic History   Marital status: Widowed    Spouse name: Not on file   Number of children: 4   Years of education: Not on file   Highest education level: 8th grade  Occupational History   Occupation: retired    Comment: school bus driver   Tobacco Use   Smoking status: Former    Packs/day: 0.25    Years: 20.00    Pack years: 5.00    Types: Cigarettes    Quit date: 10/29/1993    Years since quitting: 28.1   Smokeless tobacco: Never  Vaping Use   Vaping Use: Never used  Substance and Sexual Activity   Alcohol use: Never   Drug use: Never   Sexual activity: Not on file  Other Topics Concern   Not on file  Social History Narrative   Not on file   Social Determinants of Health   Financial Resource Strain: Not on file  Food Insecurity: Not on file  Transportation Needs: Not on file  Physical Activity: Not on file  Stress: Not on file  Social Connections: Not on file       Objective:   Physical Exam Vitals reviewed.  Constitutional:      General: She is not in acute distress.    Appearance: She is well-developed. She is obese.  HENT:     Head: Normocephalic and atraumatic.     Right Ear: Tympanic membrane normal.     Left Ear: Tympanic membrane normal.  Eyes:     Pupils: Pupils are equal, round, and reactive to light.  Neck:      Thyroid: No thyromegaly.  Cardiovascular:     Rate and Rhythm: Normal rate and regular rhythm.     Heart sounds: Normal heart sounds. No murmur heard. Pulmonary:     Effort: Pulmonary effort is normal. No respiratory distress.     Breath sounds: Normal breath sounds. No wheezing.  Abdominal:     General: Bowel sounds are normal. There is no distension.     Palpations: Abdomen is soft.     Tenderness: There is no abdominal tenderness.  Musculoskeletal:        General: No tenderness. Normal range of motion.     Cervical back: Normal range of motion and neck supple.  Skin:    General: Skin is warm and dry.  Neurological:     Mental Status: She is alert and oriented to person, place, and time.     Cranial Nerves: No cranial nerve deficit.     Deep Tendon Reflexes: Reflexes are normal and symmetric.  Psychiatric:        Behavior: Behavior normal.        Thought Content: Thought content normal.        Judgment: Judgment normal.      BP (!) 155/63    Pulse (!) 54    Temp (!) 97 F (36.1 C) (Temporal)    Ht _0  (1.499 m)    Wt 170 lb 12.8 oz (77.5 kg)    BMI 34.50 kg/m      Assessment & Plan:  Carolyn Parsons comes in today with chief complaint of Medical Management of Chronic Issues   Diagnosis and orders addressed:  1. Primary hypertension - CMP14+EGFR - CBC with Differential/Platelet  2. Aortic atherosclerosis (HCC) - CMP14+EGFR - CBC with Differential/Platelet  3. Gastroesophageal reflux disease, unspecified whether esophagitis present - CMP14+EGFR - CBC with Differential/Platelet  4. Hypothyroidism, unspecified type - CMP14+EGFR - CBC with Differential/Platelet - TSH  5. Obesity (BMI 30.0-34.9) - CMP14+EGFR - CBC with Differential/Platelet  6. Hyperlipidemia, unspecified hyperlipidemia type - CMP14+EGFR - CBC with Differential/Platelet - Lipid panel  7. Annual physical exam - CMP14+EGFR - CBC with Differential/Platelet - Lipid panel - TSH   Labs  pending Health Maintenance reviewed Diet and exercise encouraged  Follow up plan: 6 months    Carolyn Dun, FNP

## 2021-12-21 NOTE — Patient Instructions (Signed)

## 2021-12-22 LAB — LIPID PANEL
Chol/HDL Ratio: 3.7 ratio (ref 0.0–4.4)
Cholesterol, Total: 138 mg/dL (ref 100–199)
HDL: 37 mg/dL — ABNORMAL LOW (ref >39)
LDL Chol Calc (NIH): 78 mg/dL (ref 0–99)
Triglycerides: 126 mg/dL (ref 0–149)
VLDL Cholesterol Cal: 23 mg/dL (ref 5–40)

## 2021-12-22 LAB — CMP14+EGFR
ALT: 13 IU/L (ref 0–32)
AST: 19 IU/L (ref 0–40)
Albumin/Globulin Ratio: 1.6 (ref 1.2–2.2)
Albumin: 4.2 g/dL (ref 3.6–4.6)
Alkaline Phosphatase: 84 IU/L (ref 44–121)
BUN/Creatinine Ratio: 19 (ref 12–28)
BUN: 18 mg/dL (ref 8–27)
Bilirubin Total: 0.4 mg/dL (ref 0.0–1.2)
CO2: 25 mmol/L (ref 20–29)
Calcium: 9.7 mg/dL (ref 8.7–10.3)
Chloride: 102 mmol/L (ref 96–106)
Creatinine, Ser: 0.95 mg/dL (ref 0.57–1.00)
Globulin, Total: 2.7 g/dL (ref 1.5–4.5)
Glucose: 106 mg/dL — ABNORMAL HIGH (ref 70–99)
Potassium: 4.9 mmol/L (ref 3.5–5.2)
Sodium: 140 mmol/L (ref 134–144)
Total Protein: 6.9 g/dL (ref 6.0–8.5)
eGFR: 58 mL/min/{1.73_m2} — ABNORMAL LOW (ref >59)

## 2021-12-22 LAB — CBC WITH DIFFERENTIAL/PLATELET
Basophils Absolute: 0 10*3/uL (ref 0.0–0.2)
Basos: 0 %
EOS (ABSOLUTE): 0.4 10*3/uL (ref 0.0–0.4)
Eos: 4 %
Hematocrit: 43.9 % (ref 34.0–46.6)
Hemoglobin: 14.3 g/dL (ref 11.1–15.9)
Immature Grans (Abs): 0 10*3/uL (ref 0.0–0.1)
Immature Granulocytes: 0 %
Lymphocytes Absolute: 1.8 10*3/uL (ref 0.7–3.1)
Lymphs: 22 %
MCH: 29.7 pg (ref 26.6–33.0)
MCHC: 32.6 g/dL (ref 31.5–35.7)
MCV: 91 fL (ref 79–97)
Monocytes Absolute: 0.6 10*3/uL (ref 0.1–0.9)
Monocytes: 7 %
Neutrophils Absolute: 5.2 10*3/uL (ref 1.4–7.0)
Neutrophils: 67 %
Platelets: 218 10*3/uL (ref 150–450)
RBC: 4.81 x10E6/uL (ref 3.77–5.28)
RDW: 12.5 % (ref 11.7–15.4)
WBC: 7.9 10*3/uL (ref 3.4–10.8)

## 2021-12-22 LAB — TSH: TSH: 3.15 u[IU]/mL (ref 0.450–4.500)

## 2022-02-22 ENCOUNTER — Other Ambulatory Visit: Payer: Self-pay | Admitting: Family

## 2022-05-19 ENCOUNTER — Other Ambulatory Visit: Payer: Self-pay | Admitting: Family

## 2022-05-19 DIAGNOSIS — K219 Gastro-esophageal reflux disease without esophagitis: Secondary | ICD-10-CM

## 2022-05-19 DIAGNOSIS — I10 Essential (primary) hypertension: Secondary | ICD-10-CM

## 2022-05-19 DIAGNOSIS — E785 Hyperlipidemia, unspecified: Secondary | ICD-10-CM

## 2022-06-15 ENCOUNTER — Ambulatory Visit: Payer: Medicare HMO | Admitting: Family

## 2022-07-17 ENCOUNTER — Encounter: Payer: Self-pay | Admitting: Family

## 2022-07-17 ENCOUNTER — Ambulatory Visit (INDEPENDENT_AMBULATORY_CARE_PROVIDER_SITE_OTHER): Payer: Medicare HMO | Admitting: Family

## 2022-07-17 VITALS — BP 148/59 | HR 58 | Temp 98.2°F | Ht 59.0 in | Wt 172.6 lb

## 2022-07-17 DIAGNOSIS — I700 Atherosclerosis of aorta: Secondary | ICD-10-CM

## 2022-07-17 DIAGNOSIS — J301 Allergic rhinitis due to pollen: Secondary | ICD-10-CM | POA: Diagnosis not present

## 2022-07-17 DIAGNOSIS — E039 Hypothyroidism, unspecified: Secondary | ICD-10-CM

## 2022-07-17 DIAGNOSIS — K219 Gastro-esophageal reflux disease without esophagitis: Secondary | ICD-10-CM

## 2022-07-17 DIAGNOSIS — E785 Hyperlipidemia, unspecified: Secondary | ICD-10-CM

## 2022-07-17 DIAGNOSIS — Z23 Encounter for immunization: Secondary | ICD-10-CM | POA: Diagnosis not present

## 2022-07-17 DIAGNOSIS — I10 Essential (primary) hypertension: Secondary | ICD-10-CM | POA: Diagnosis not present

## 2022-07-17 LAB — CMP14+EGFR
ALT: 11 IU/L (ref 0–32)
AST: 14 IU/L (ref 0–40)
Albumin/Globulin Ratio: 1.5 (ref 1.2–2.2)
Albumin: 4.1 g/dL (ref 3.7–4.7)
Alkaline Phosphatase: 74 IU/L (ref 44–121)
BUN/Creatinine Ratio: 19 (ref 12–28)
BUN: 18 mg/dL (ref 8–27)
Bilirubin Total: 0.5 mg/dL (ref 0.0–1.2)
CO2: 24 mmol/L (ref 20–29)
Calcium: 9.2 mg/dL (ref 8.7–10.3)
Chloride: 104 mmol/L (ref 96–106)
Creatinine, Ser: 0.97 mg/dL (ref 0.57–1.00)
Globulin, Total: 2.8 g/dL (ref 1.5–4.5)
Glucose: 112 mg/dL — ABNORMAL HIGH (ref 70–99)
Potassium: 5.2 mmol/L (ref 3.5–5.2)
Sodium: 141 mmol/L (ref 134–144)
Total Protein: 6.9 g/dL (ref 6.0–8.5)
eGFR: 57 mL/min/{1.73_m2} — ABNORMAL LOW (ref >59)

## 2022-07-17 LAB — CBC WITH DIFFERENTIAL/PLATELET
Basophils Absolute: 0 10*3/uL (ref 0.0–0.2)
Basos: 0 %
EOS (ABSOLUTE): 0.2 10*3/uL (ref 0.0–0.4)
Eos: 2 %
Hematocrit: 43.1 % (ref 34.0–46.6)
Hemoglobin: 14.4 g/dL (ref 11.1–15.9)
Immature Grans (Abs): 0 10*3/uL (ref 0.0–0.1)
Immature Granulocytes: 0 %
Lymphocytes Absolute: 1.9 10*3/uL (ref 0.7–3.1)
Lymphs: 24 %
MCH: 30.3 pg (ref 26.6–33.0)
MCHC: 33.4 g/dL (ref 31.5–35.7)
MCV: 91 fL (ref 79–97)
Monocytes Absolute: 0.7 10*3/uL (ref 0.1–0.9)
Monocytes: 9 %
Neutrophils Absolute: 5.2 10*3/uL (ref 1.4–7.0)
Neutrophils: 65 %
Platelets: 196 10*3/uL (ref 150–450)
RBC: 4.76 x10E6/uL (ref 3.77–5.28)
RDW: 12.2 % (ref 11.7–15.4)
WBC: 8 10*3/uL (ref 3.4–10.8)

## 2022-07-17 MED ORDER — METOPROLOL SUCCINATE ER 25 MG PO TB24: 25.0000 mg | ORAL_TABLET | Freq: Every day | ORAL | 3 refills | Status: DC

## 2022-07-17 MED ORDER — PANTOPRAZOLE SODIUM 40 MG PO TBEC: 40.0000 mg | DELAYED_RELEASE_TABLET | Freq: Every day | ORAL | 3 refills | Status: DC

## 2022-07-17 MED ORDER — FLUTICASONE PROPIONATE 50 MCG/ACT NA SUSP: 1.0000 | Freq: Every day | NASAL | 3 refills | Status: DC

## 2022-07-17 MED ORDER — AMLODIPINE BESYLATE 5 MG PO TABS: 5.0000 mg | ORAL_TABLET | Freq: Every day | ORAL | 3 refills | Status: DC

## 2022-07-17 MED ORDER — ROSUVASTATIN CALCIUM 20 MG PO TABS: 20.0000 mg | ORAL_TABLET | Freq: Every day | ORAL | 3 refills | Status: DC

## 2022-07-17 NOTE — Progress Notes (Signed)
Subjective:    Patient ID: Carolyn Parsons, female    DOB: 02-Mar-1932, 88 y.o.   MRN: [id number]  Chief Complaint  Patient presents with   Medical Management of Chronic Issues    None concerns. Flu shot today    Pt presents to the office today for chronic follow up. She has seen chronic back pain, she has seen Pain management for injections that has helped in the past.    She aortic atherosclerosis and takes Crestor 20 mg daily.  Hypertension This is a chronic problem. The current episode started more than 1 year ago. The problem has been waxing and waning since onset. The problem is uncontrolled. Pertinent negatives include no malaise/fatigue, peripheral edema or shortness of breath. Risk factors for coronary artery disease include dyslipidemia and obesity. The current treatment provides moderate improvement. Identifiable causes of hypertension include a thyroid problem.  Gastroesophageal Reflux She reports no belching, no choking or no heartburn. This is a chronic problem. The current episode started more than 1 year ago. The problem occurs occasionally. The symptoms are aggravated by certain foods. Risk factors include obesity. She has tried a PPI for the symptoms. The treatment provided moderate relief.  Thyroid Problem Presents for follow-up visit. Symptoms include dry skin. Patient reports no anxiety, cold intolerance or diarrhea. The symptoms have been stable. Her past medical history is significant for hyperlipidemia.  Hyperlipidemia This is a chronic problem. The current episode started more than 1 year ago. The problem is controlled. Recent lipid tests were reviewed and are normal. Exacerbating diseases include obesity. Pertinent negatives include no shortness of breath. Current antihyperlipidemic treatment includes statins. The current treatment provides moderate improvement of lipids. Risk factors for coronary artery disease include dyslipidemia, hypertension, post-menopausal and a  sedentary lifestyle.      Review of Systems  Constitutional:  Negative for malaise/fatigue.  Respiratory:  Negative for choking and shortness of breath.   Gastrointestinal:  Negative for diarrhea and heartburn.  Endocrine: Negative for cold intolerance.  Psychiatric/Behavioral:  The patient is not nervous/anxious.   All other systems reviewed and are negative.      Objective:   Physical Exam Vitals reviewed.  Constitutional:      General: She is not in acute distress.    Appearance: She is well-developed. She is obese.  HENT:     Head: Normocephalic and atraumatic.     Right Ear: Tympanic membrane normal.     Left Ear: Tympanic membrane normal.  Eyes:     Pupils: Pupils are equal, round, and reactive to light.  Neck:     Thyroid: No thyromegaly.  Cardiovascular:     Rate and Rhythm: Normal rate and regular rhythm.     Heart sounds: Normal heart sounds. No murmur heard. Pulmonary:     Effort: Pulmonary effort is normal. No respiratory distress.     Breath sounds: Normal breath sounds. No wheezing.  Abdominal:     General: Bowel sounds are normal. There is no distension.     Palpations: Abdomen is soft.     Tenderness: There is no abdominal tenderness.  Musculoskeletal:        General: No tenderness. Normal range of motion.     Cervical back: Normal range of motion and neck supple.  Skin:    General: Skin is warm and dry.  Neurological:     Mental Status: She is alert and oriented to person, place, and time.     Cranial Nerves: No cranial nerve deficit.  Deep Tendon Reflexes: Reflexes are normal and symmetric.  Psychiatric:        Behavior: Behavior normal.        Thought Content: Thought content normal.        Judgment: Judgment normal.       BP (!) 148/59   Pulse (!) 58   Temp 98.2 F (36.8 C) (Temporal)   Ht '4\' 11"'  (1.499 m)   Wt 172 lb 9.6 oz (78.3 kg)   SpO2 95%   BMI 34.86 kg/m      Assessment & Plan:  Carolyn Parsons comes in today with chief  complaint of Medical Management of Chronic Issues (None concerns. Flu shot today )   Diagnosis and orders addressed:  1. Essential hypertension - metoprolol succinate (TOPROL-XL) 25 MG 24 hr tablet; Take 1 tablet (25 mg total) by mouth daily.  Dispense: 90 tablet; Refill: 3 - amLODipine (NORVASC) 5 MG tablet; Take 1 tablet (5 mg total) by mouth daily.  Dispense: 90 tablet; Refill: 3 - CMP14+EGFR - CBC with Differential/Platelet  2. Gastroesophageal reflux disease, unspecified whether esophagitis present - pantoprazole (PROTONIX) 40 MG tablet; Take 1 tablet (40 mg total) by mouth daily.  Dispense: 90 tablet; Refill: 3 - CMP14+EGFR - CBC with Differential/Platelet  3. Hyperlipidemia, unspecified hyperlipidemia type - rosuvastatin (CRESTOR) 20 MG tablet; Take 1 tablet (20 mg total) by mouth daily.  Dispense: 90 tablet; Refill: 3 - CMP14+EGFR - CBC with Differential/Platelet  4. Allergic rhinitis due to pollen, unspecified seasonality - fluticasone (FLONASE) 50 MCG/ACT nasal spray; Place 1 spray into both nostrils daily.  Dispense: 48 g; Refill: 3 - CMP14+EGFR - CBC with Differential/Platelet  5. Primary hypertension - CMP14+EGFR - CBC with Differential/Platelet  6. Aortic atherosclerosis (HCC) - CMP14+EGFR - CBC with Differential/Platelet  7. Hypothyroidism, unspecified type - CMP14+EGFR - CBC with Differential/Platelet   Labs pending Health Maintenance reviewed Diet and exercise encouraged  Follow up plan: 6 months    Evelina Dun, FNP

## 2022-07-17 NOTE — Addendum Note (Signed)
Addended by: Ladean Raya on: 07/17/2022 08:52 AM   Modules accepted: Orders

## 2022-07-17 NOTE — Patient Instructions (Signed)
Health Maintenance After Age 86 After age 86, you are at a higher risk for certain long-term diseases and infections as well as injuries from falls. Falls are a major cause of broken bones and head injuries in people who are older than age 86. Getting regular preventive care can help to keep you healthy and well. Preventive care includes getting regular testing and making lifestyle changes as recommended by your health care provider. Talk with your health care provider about: Which screenings and tests you should have. A screening is a test that checks for a disease when you have no symptoms. A diet and exercise plan that is right for you. What should I know about screenings and tests to prevent falls? Screening and testing are the best ways to find a health problem early. Early diagnosis and treatment give you the best chance of managing medical conditions that are common after age 86. Certain conditions and lifestyle choices may make you more likely to have a fall. Your health care provider may recommend: Regular vision checks. Poor vision and conditions such as cataracts can make you more likely to have a fall. If you wear glasses, make sure to get your prescription updated if your vision changes. Medicine review. Work with your health care provider to regularly review all of the medicines you are taking, including over-the-counter medicines. Ask your health care provider about any side effects that may make you more likely to have a fall. Tell your health care provider if any medicines that you take make you feel dizzy or sleepy. Strength and balance checks. Your health care provider may recommend certain tests to check your strength and balance while standing, walking, or changing positions. Foot health exam. Foot pain and numbness, as well as not wearing proper footwear, can make you more likely to have a fall. Screenings, including: Osteoporosis screening. Osteoporosis is a condition that causes  the bones to get weaker and break more easily. Blood pressure screening. Blood pressure changes and medicines to control blood pressure can make you feel dizzy. Depression screening. You may be more likely to have a fall if you have a fear of falling, feel depressed, or feel unable to do activities that you used to do. Alcohol use screening. Using too much alcohol can affect your balance and may make you more likely to have a fall. Follow these instructions at home: Lifestyle Do not drink alcohol if: Your health care provider tells you not to drink. If you drink alcohol: Limit how much you have to: 0-1 drink a day for women. 0-2 drinks a day for men. Know how much alcohol is in your drink. In the [place] one drink equals one 12 oz bottle of beer (355 mL), one 5 oz glass of wine (148 mL), or one 1 oz glass of hard liquor (44 mL). Do not use any products that contain nicotine or tobacco. These products include cigarettes, chewing tobacco, and vaping devices, such as e-cigarettes. If you need help quitting, ask your health care provider. Activity  Follow a regular exercise program to stay fit. This will help you maintain your balance. Ask your health care provider what types of exercise are appropriate for you. If you need a cane or walker, use it as recommended by your health care provider. Wear supportive shoes that have nonskid soles. Safety  Remove any tripping hazards, such as rugs, cords, and clutter. Install safety equipment such as grab bars in bathrooms and safety rails on stairs. Keep rooms and walkways   well-lit. General instructions Talk with your health care provider about your risks for falling. Tell your health care provider if: You fall. Be sure to tell your health care provider about all falls, even ones that seem minor. You feel dizzy, tiredness (fatigue), or off-balance. Take over-the-counter and prescription medicines only as told by your health care provider. These include  supplements. Eat a healthy diet and maintain a healthy weight. A healthy diet includes low-fat dairy products, low-fat (lean) meats, and fiber from whole grains, beans, and lots of fruits and vegetables. Stay current with your vaccines. Schedule regular health, dental, and eye exams. Summary Having a healthy lifestyle and getting preventive care can help to protect your health and wellness after age 86. Screening and testing are the best way to find a health problem early and help you avoid having a fall. Early diagnosis and treatment give you the best chance for managing medical conditions that are more common for people who are older than age 86. Falls are a major cause of broken bones and head injuries in people who are older than age 86. Take precautions to prevent a fall at home. Work with your health care provider to learn what changes you can make to improve your health and wellness and to prevent falls. This information is not intended to replace advice given to you by your health care provider. Make sure you discuss any questions you have with your health care provider. Document Revised: 03/06/2021 Document Reviewed: 03/06/2021 Elsevier Patient Education  2023 Elsevier Inc.  

## 2022-10-09 ENCOUNTER — Other Ambulatory Visit: Payer: Self-pay | Admitting: Family

## 2022-11-01 ENCOUNTER — Encounter: Payer: Self-pay | Admitting: Orthopaedic Surgery

## 2022-11-01 ENCOUNTER — Ambulatory Visit (INDEPENDENT_AMBULATORY_CARE_PROVIDER_SITE_OTHER): Payer: Medicare HMO | Admitting: Orthopaedic Surgery

## 2022-11-01 VITALS — Ht 59.0 in | Wt 170.0 lb

## 2022-11-01 DIAGNOSIS — M654 Radial styloid tenosynovitis [de Quervain]: Secondary | ICD-10-CM | POA: Diagnosis not present

## 2022-11-01 NOTE — Progress Notes (Signed)
Office Visit Note   Patient: Carolyn Parsons           Date of Birth: 1932/08/08           MRN: [id number] Visit Date: 11/01/2022              Requested by: Sharion Balloon, [address],  [place] [address] PCP: Sharion Balloon, FNP   Assessment & Plan: Visit Diagnoses:  1. Tendinitis, de Quervain's     Plan: Thumb radial gutter splint applied she can remove it for washing her hands or bathing.  She has persistent problems she can return for injection.  Pathophysiology discussed.  She can apply some Aspercreme 3 times a day over the area.  Follow-Up Instructions: No follow-ups on file.   Orders:  No orders of the defined types were placed in this encounter.  No orders of the defined types were placed in this encounter.     Procedures: No procedures performed   Clinical Data: No additional findings.   Subjective: Chief Complaint  Patient presents with   Left Wrist - Pain    HPI 89 year old female here with her daughter with problems with left wrist pain.  She has been using puzzle books holding with her left hand and started having sharp pain over the first dorsal compartment.  She has tried medication with no relief.  Past history of tendinitis in her elbow treated with a tennis elbow splint with resolution.  She denies associated neck pain.  No numbness or tingling in the fingers.  Review of Systems all other systems noncontributory to HPI.   Objective: Vital Signs: Ht 4\' 11"  (1.499 m)   Wt 170 lb (77.1 kg)   BMI 34.34 kg/m   Physical Exam Constitutional:      Appearance: She is well-developed.  HENT:     Head: Normocephalic.     Right Ear: External ear normal.     Left Ear: External ear normal. There is no impacted cerumen.  Eyes:     Pupils: Pupils are equal, round, and reactive to light.  Neck:     Thyroid: No thyromegaly.     Trachea: No tracheal deviation.  Cardiovascular:     Rate and Rhythm: Normal rate.  Pulmonary:      Effort: Pulmonary effort is normal.  Abdominal:     Palpations: Abdomen is soft.  Musculoskeletal:     Cervical back: No rigidity.  Skin:    General: Skin is warm and dry.  Neurological:     Mental Status: She is alert and oriented to person, place, and time.  Psychiatric:        Behavior: Behavior normal.     Ortho Exam left positive Finkelstein test.  No thenar atrophy or weakness.  States the fingertips is intact.  Dorsal compartments 2 through 6 are normal.  Specialty Comments:  No specialty comments available.  Imaging: No results found.   PMFS History: Patient Active Problem List   Diagnosis Date Noted   Tendinitis, de Quervain's 11/01/2022   Aortic atherosclerosis ([place]) 11/20/2018   Hypothyroidism 11/14/2018   Hyperlipemia 05/16/2018   GERD (gastroesophageal reflux disease) 05/16/2018   Hypertension 05/16/2018   Obesity (BMI 30.0-34.9) 05/16/2018   Past Medical History:  Diagnosis Date   Cataract    Hyperlipidemia    Hypertension     Family History  Problem Relation Age of Onset   Cancer Mother    Cancer Father  Brain    Heart disease Sister    Leukemia Sister    Cancer Brother        Prostate   Hyperlipidemia Daughter    Hypertension Daughter    Heart disease Son    Dementia Sister    Heart disease Son    Hyperlipidemia Son    Hypertension Son     Past Surgical History:  Procedure Laterality Date   APPENDECTOMY     BLADDER REPAIR     CATARACT EXTRACTION, BILATERAL     CHOLECYSTECTOMY     Social History   Occupational History   Occupation: retired    Comment: school bus driver   Tobacco Use   Smoking status: Former    Packs/day: 0.25    Years: 20.00    Total pack years: 5.00    Types: Cigarettes    Quit date: 10/29/1993    Years since quitting: 29.0   Smokeless tobacco: Never  Vaping Use   Vaping Use: Never used  Substance and Sexual Activity   Alcohol use: Never   Drug use: Never   Sexual activity: Not on file

## 2022-11-07 ENCOUNTER — Ambulatory Visit: Payer: Medicare HMO | Admitting: Orthopaedic Surgery

## 2022-12-21 ENCOUNTER — Encounter (INDEPENDENT_AMBULATORY_CARE_PROVIDER_SITE_OTHER): Payer: Medicare HMO

## 2022-12-21 DIAGNOSIS — N6310 Unspecified lump in the right breast, unspecified quadrant: Secondary | ICD-10-CM

## 2022-12-24 NOTE — Telephone Encounter (Signed)
Hello, I have placed a referral for a diagnostic mammogram and ultrasound in [place]. They should contact you to set it up. Let me know if you have any problems.   Evelina Dun, FNP  Approximately 5 minutes was spent documenting and reviewing patient's chart.

## 2022-12-28 ENCOUNTER — Other Ambulatory Visit: Payer: Self-pay

## 2022-12-28 DIAGNOSIS — N63 Unspecified lump in unspecified breast: Secondary | ICD-10-CM

## 2023-01-14 ENCOUNTER — Telehealth: Payer: Self-pay | Admitting: Family

## 2023-01-14 NOTE — Telephone Encounter (Signed)
Called and spoke Carolyn Parsons patient will come in at 8am that morning.

## 2023-01-15 ENCOUNTER — Ambulatory Visit: Payer: Medicare HMO

## 2023-01-22 ENCOUNTER — Other Ambulatory Visit: Payer: Self-pay | Admitting: Family

## 2023-01-22 ENCOUNTER — Ambulatory Visit
Admission: RE | Admit: 2023-01-22 | Discharge: 2023-01-22 | Disposition: A | Discharge status: Home / Self Care | Payer: Medicare HMO | Source: Ambulatory Visit | Attending: Family | Admitting: Family

## 2023-01-22 DIAGNOSIS — N6310 Unspecified lump in the right breast, unspecified quadrant: Secondary | ICD-10-CM

## 2023-01-22 DIAGNOSIS — N63 Unspecified lump in unspecified breast: Secondary | ICD-10-CM

## 2023-01-24 ENCOUNTER — Other Ambulatory Visit: Payer: Self-pay | Admitting: Family

## 2023-01-24 DIAGNOSIS — N63 Unspecified lump in unspecified breast: Secondary | ICD-10-CM

## 2023-01-25 ENCOUNTER — Ambulatory Visit
Admission: RE | Admit: 2023-01-25 | Discharge: 2023-01-25 | Disposition: A | Discharge status: Home / Self Care | Payer: Medicare HMO | Source: Ambulatory Visit | Attending: Family | Admitting: Family

## 2023-01-25 ENCOUNTER — Other Ambulatory Visit: Payer: Self-pay | Admitting: Family

## 2023-01-25 ENCOUNTER — Other Ambulatory Visit: Payer: Medicare HMO

## 2023-01-25 DIAGNOSIS — N6321 Unspecified lump in the left breast, upper outer quadrant: Secondary | ICD-10-CM

## 2023-01-25 DIAGNOSIS — N63 Unspecified lump in unspecified breast: Secondary | ICD-10-CM

## 2023-01-25 DIAGNOSIS — N6310 Unspecified lump in the right breast, unspecified quadrant: Secondary | ICD-10-CM

## 2023-01-25 DIAGNOSIS — N6320 Unspecified lump in the left breast, unspecified quadrant: Secondary | ICD-10-CM

## 2023-01-25 DIAGNOSIS — N6322 Unspecified lump in the left breast, upper inner quadrant: Secondary | ICD-10-CM

## 2023-01-25 HISTORY — PX: BREAST BIOPSY: SHX20

## 2023-01-29 LAB — SURGICAL PATHOLOGY

## 2023-01-31 ENCOUNTER — Telehealth: Payer: Self-pay | Admitting: Hematology and Oncology

## 2023-01-31 NOTE — Telephone Encounter (Signed)
scheduled per 4/4 sch msg, pt daughter has been called and confirmed date and time. Pt is aware of location and to arrive early for check in

## 2023-02-20 ENCOUNTER — Inpatient Hospital Stay: Payer: Medicare HMO | Attending: Hematology and Oncology | Admitting: Hematology and Oncology

## 2023-02-20 ENCOUNTER — Other Ambulatory Visit: Payer: Self-pay

## 2023-02-20 ENCOUNTER — Inpatient Hospital Stay: Payer: Medicare HMO

## 2023-02-20 ENCOUNTER — Other Ambulatory Visit: Payer: Self-pay | Admitting: General Surgery

## 2023-02-20 VITALS — BP 173/60 | HR 67 | Temp 97.3°F | Resp 18 | Ht 59.0 in | Wt 172.7 lb

## 2023-02-20 DIAGNOSIS — C50411 Malignant neoplasm of upper-outer quadrant of right female breast: Secondary | ICD-10-CM | POA: Diagnosis not present

## 2023-02-20 DIAGNOSIS — Z170 Estrogen receptor positive status [ER+]: Secondary | ICD-10-CM | POA: Diagnosis not present

## 2023-02-20 DIAGNOSIS — R928 Other abnormal and inconclusive findings on diagnostic imaging of breast: Secondary | ICD-10-CM

## 2023-02-20 DIAGNOSIS — C50011 Malignant neoplasm of nipple and areola, right female breast: Secondary | ICD-10-CM

## 2023-02-20 NOTE — Progress Notes (Signed)
[facility] CONSULT NOTE  Patient Care Team: [name], FNP as PCP - General (Family Medicine) Kathryne Hitch, MD as Consulting Physician (Orthopedic Surgery)  CHIEF COMPLAINTS/PURPOSE OF CONSULTATION:  Newly diagnosed breast cancer  HISTORY OF PRESENTING ILLNESS:  Carolyn Parsons 89 y.o. female is here because of recent diagnosis of bilateral breast cancers.  She felt a lump in the right breast near the nipple which led to mammogram ultrasound evaluation.  The right breast mass was 1.4 cm and biopsy came back as grade 2 IDC that was ER/PR positive HER2 negative.  Incidentally on the left side there were multiple abnormalities including a 0.7 cm, is a 1.4 cm, 7 mm and 5 mm masses.  She underwent biopsy of the first 2 masses which came back as invasive mammary cancer that was also ER/PR positive HER2 negative.  The 7 mm mass was benign and a 5 mm mass is yet to be biopsied. She was seen by Dr. Dwain Sarna who recommended bilateral lumpectomies.  She was presented this morning to the multidisciplinary tumor board and she is here today accompanied by her family to discuss the results.  I reviewed her records extensively and collaborated the history with the patient.  SUMMARY OF ONCOLOGIC HISTORY: Oncology History  Malignant neoplasm of upper-outer quadrant of right breast in female, estrogen receptor positive  01/25/2023 Initial Diagnosis   Palpable abnormality right breast retroareolar 11 o'clock position: 1.4 cm, adjacent mass 0.4 cm, axilla negative: Biopsy grade 2 IDC ER 95%, PR 20%, HER2 2+ by IHC FISH negative Left breast spiculated mass 7 mm at 1:00, 1.6 cm at 12:00, 2 masses at 11 o'clock position biopsy revealed grade 2 invasive mammary cancer with papillary features ER?,  PR 100%, Ki67 5%, HER2 2+ by IHC, FISH negative 0.7 cm (benign) and 0.5 cm (not biopsied)      MEDICAL HISTORY:  Past Medical History:  Diagnosis Date   Cataract    Hyperlipidemia     Hypertension     SURGICAL HISTORY: Past Surgical History:  Procedure Laterality Date   APPENDECTOMY     BLADDER REPAIR     BREAST BIOPSY Left 01/25/2023   [place] LT BREAST BX W LOC DEV EA ADD LESION IMG BX SPEC US GUIDE 01/25/2023 [facility]   BREAST BIOPSY Left 01/25/2023   [place] LT BREAST BX W LOC DEV 1ST LESION IMG BX SPEC US GUIDE 01/25/2023 [facility]   BREAST BIOPSY Left 01/25/2023   [place] LT BREAST BX W LOC DEV EA ADD LESION IMG BX SPEC US GUIDE 01/25/2023 [facility]   BREAST BIOPSY Right 01/25/2023   [place] RT BREAST BX W LOC DEV 1ST LESION IMG BX SPEC US GUIDE 01/25/2023 [facility]   CATARACT EXTRACTION, BILATERAL     CHOLECYSTECTOMY      SOCIAL HISTORY: Social History   Socioeconomic History   Marital status: Widowed    Spouse name: Not on file   Number of children: 4   Years of education: Not on file   Highest education level: 8th grade  Occupational History   Occupation: retired    Comment: school bus driver   Tobacco Use   Smoking status: Former    Packs/day: 0.25    Years: 20.00    Additional pack years: 0.00    Total pack years: 5.00    Types: Cigarettes    Quit date: 10/29/1993    Years since quitting: 29.3   Smokeless tobacco: Never  Vaping Use  Vaping Use: Never used  Substance and Sexual Activity   Alcohol use: Never   Drug use: Never   Sexual activity: Not on file  Other Topics Concern   Not on file  Social History Narrative   Not on file   Social Determinants of Health   Financial Resource Strain: Low Risk  (12/01/2018)   Overall Financial Resource Strain (CARDIA)    Difficulty of Paying Living Expenses: Not hard at all  Food Insecurity: No Food Insecurity (12/01/2018)   Hunger Vital Sign    Worried About Running Out of Food in the Last Year: Never true    Ran Out of Food in the Last Year: Never true  Transportation Needs: No Transportation Needs (12/01/2018)   PRAPARE - Administrator, Civil Service (Medical): No     Lack of Transportation (Non-Medical): No  Physical Activity: Inactive (12/01/2018)   Exercise Vital Sign    Days of Exercise per Week: 0 days    Minutes of Exercise per Session: 0 min  Stress: Not on file  Social Connections: Moderately Isolated (12/01/2018)   Social Connection and Isolation Panel [NHANES]    Frequency of Communication with Friends and Family: Once a week    Frequency of Social Gatherings with Friends and Family: More than three times a week    Attends Religious Services: Never    Database administrator or Organizations: No    Attends Banker Meetings: Never    Marital Status: Widowed  Intimate Partner Violence: Not At Risk (12/01/2018)   Humiliation, Afraid, Rape, and Kick questionnaire    Fear of Current or Ex-Partner: No    Emotionally Abused: No    Physically Abused: No    Sexually Abused: No    FAMILY HISTORY: Family History  Problem Relation Age of Onset   Cancer Mother    Cancer Father        Brain    Heart disease Sister    Leukemia Sister    Cancer Brother        Prostate   Hyperlipidemia Daughter    Hypertension Daughter    Heart disease Son    Dementia Sister    Heart disease Son    Hyperlipidemia Son    Hypertension Son     ALLERGIES:  is allergic to amoxicillin and penicillins.  MEDICATIONS:  Current Outpatient Medications  Medication Sig Dispense Refill   amLODipine (NORVASC) 5 MG tablet Take 1 tablet (5 mg total) by mouth daily. 90 tablet 3   brimonidine-timolol (COMBIGAN) 0.2-0.5 % ophthalmic solution INSTILL 1 DROP INTO BOTH  EYES EVERY 12 HOURS 25 mL 3   fexofenadine (ALLEGRA) 180 MG tablet Take 1 tablet (180 mg total) by mouth daily. 90 tablet 2   fluticasone (FLONASE) 50 MCG/ACT nasal spray Place 1 spray into both nostrils daily. 48 g 3   levothyroxine (SYNTHROID) 50 MCG tablet TAKE 1 TABLET BY MOUTH DAILY 90 tablet 3   metoprolol succinate (TOPROL-XL) 25 MG 24 hr tablet Take 1 tablet (25 mg total) by mouth daily. 90  tablet 3   pantoprazole (PROTONIX) 40 MG tablet Take 1 tablet (40 mg total) by mouth daily. 90 tablet 3   rosuvastatin (CRESTOR) 20 MG tablet Take 1 tablet (20 mg total) by mouth daily. 90 tablet 3   No current facility-administered medications for this visit.    REVIEW OF SYSTEMS:   Constitutional: Denies fevers, chills or abnormal night sweats Breast: Palpable right breast lump All other  systems were reviewed with the patient and are negative.  PHYSICAL EXAMINATION: ECOG PERFORMANCE STATUS: 1 - Symptomatic but completely ambulatory  Vitals:   02/20/23 1259  BP: (!) 173/60  Pulse: 67  Resp: 18  Temp: (!) 97.3 F (36.3 C)  SpO2: 96%   Filed Weights   02/20/23 1259  Weight: 172 lb 11.2 oz (78.3 kg)    GENERAL:alert, no distress and comfortable    LABORATORY DATA:  I have reviewed the data as listed Lab Results  Component Value Date   WBC 8.0 07/17/2022   HGB 14.4 07/17/2022   HCT 43.1 07/17/2022   MCV 91 07/17/2022   PLT 196 07/17/2022   Lab Results  Component Value Date   NA 141 07/17/2022   K 5.2 07/17/2022   CL 104 07/17/2022   CO2 24 07/17/2022    RADIOGRAPHIC STUDIES: I have personally reviewed the radiological reports and agreed with the findings in the report.  ASSESSMENT AND PLAN:  Malignant neoplasm of upper-outer quadrant of right breast in female, estrogen receptor positive 01/25/2023: Palpable abnormality right breast retroareolar 11 o'clock position: 1.4 cm, adjacent mass 0.4 cm, axilla negative: Biopsy grade 2 IDC ER 95%, PR 20%, HER2 2+ by IHC FISH negative Left breast spiculated mass 7 mm at 1:00, 1.6 cm at 12:00, 2 masses at 11 o'clock position biopsy revealed grade 2 invasive mammary cancer with papillary features ER?,  PR 100%, Ki67 5%, HER2 2+ by IHC, FISH negative 0.7 cm (benign) and 0.5 cm (not biopsied)  Pathology and radiology counseling: Discussed with the patient, the details of pathology including the type of breast cancer,the  clinical staging, the significance of ER, PR and HER-2/neu receptors and the implications for treatment. After reviewing the pathology in detail, we proceeded to discuss the different treatment options between surgery, radiation,  antiestrogen therapies.  Patient is in excellent physical condition.  Treatment plan: Bilateral lumpectomies +/- Adjuvant radiation Antiestrogen therapy  Return to clinic after surgery to discuss pathology report   All questions were answered. The patient knows to call the clinic with any problems, questions or concerns.    Tamsen Meek, MD 02/20/23

## 2023-02-20 NOTE — Research (Signed)
[employer] 2021-05 - Specimen Collection Study to Evaluate Biomarkers in Subjects with Cancer     Patient Carolyn Parsons was identified by Dr. Pamelia Hoit  as a potential candidate for the above listed study.  This Clinical Research Coordinator met with [name], [id number], on 02/20/23 in a manner and location that ensures patient privacy to discuss participation in the above listed research study.  Patient is Accompanied by family members .  A copy of the informed consent document with embedded HIPAA language was provided to the patient.  Patient reads, speaks, and understands [place].   Patient was provided with the business card of this Coordinator and encouraged to contact the research team with any questions.  Approximately 15 minutes were spent with the patient reviewing the informed consent documents.  Patient was provided the option of taking informed consent documents home to review and was encouraged to review at their convenience with their support network, including other care providers. Patient took the consent documents home to review.   The patient was interested in participating in the study and will be doing the study procedures on May 1st 2024 at 0800am. But they did sign the consent today electing to read the consent more over the weekend.    Felecia Jan, [facility] 02/20/2023 1:58 PM

## 2023-02-20 NOTE — Assessment & Plan Note (Signed)
01/25/2023: Palpable abnormality right breast retroareolar 11 o'clock position: 1.4 cm, adjacent mass 0.4 cm, axilla negative: Biopsy grade 2 IDC ER 95%, PR 20%, HER2 2+ by IHC FISH negative Left breast spiculated mass 7 mm at 1:00, 1.6 cm at 12:00, 2 masses at 11 o'clock position biopsy revealed grade 2 invasive mammary cancer with papillary features ER?,  PR 100%, Ki67 5%, HER2 2+ by IHC, FISH negative 0.7 cm (benign) and 0.5 cm (not biopsied)  Pathology and radiology counseling: Discussed with the patient, the details of pathology including the type of breast cancer,the clinical staging, the significance of ER, PR and HER-2/neu receptors and the implications for treatment. After reviewing the pathology in detail, we proceeded to discuss the different treatment options between surgery, radiation,  antiestrogen therapies.  Treatment plan: Bilateral lumpectomies +/- Adjuvant radiation Antiestrogen therapy  Return to clinic after surgery to discuss pathology report

## 2023-02-21 ENCOUNTER — Ambulatory Visit
Admission: RE | Admit: 2023-02-21 | Discharge: 2023-02-21 | Disposition: A | Discharge status: Home / Self Care | Payer: Medicare HMO | Source: Ambulatory Visit | Attending: General Surgery | Admitting: General Surgery

## 2023-02-21 ENCOUNTER — Other Ambulatory Visit: Payer: Self-pay | Admitting: General Surgery

## 2023-02-21 DIAGNOSIS — C50011 Malignant neoplasm of nipple and areola, right female breast: Secondary | ICD-10-CM

## 2023-02-21 DIAGNOSIS — R928 Other abnormal and inconclusive findings on diagnostic imaging of breast: Secondary | ICD-10-CM

## 2023-02-21 HISTORY — PX: BREAST BIOPSY: SHX20

## 2023-02-22 ENCOUNTER — Encounter: Payer: Self-pay | Admitting: *Deleted

## 2023-02-22 ENCOUNTER — Other Ambulatory Visit: Payer: Medicare HMO

## 2023-02-22 ENCOUNTER — Other Ambulatory Visit: Payer: Self-pay | Admitting: *Deleted

## 2023-02-22 ENCOUNTER — Telehealth: Payer: Self-pay

## 2023-02-22 ENCOUNTER — Telehealth: Payer: Self-pay | Admitting: *Deleted

## 2023-02-22 DIAGNOSIS — C50812 Malignant neoplasm of overlapping sites of left female breast: Secondary | ICD-10-CM | POA: Insufficient documentation

## 2023-02-22 DIAGNOSIS — Z170 Estrogen receptor positive status [ER+]: Secondary | ICD-10-CM

## 2023-02-22 NOTE — Telephone Encounter (Signed)
Spoke with son -in law to follow up from new patient appt  He states the patient is not available at the moment. He states her daughter is taking care of everything with the patient and will call if they have any questions or concerns. Contact information given.

## 2023-02-22 NOTE — Telephone Encounter (Signed)
Received call from pt's daughter, Richarda Osmond to schedule MD f/u 1 week post surgery. She is scheduled 04/03/23 at 0830. Pt is asking about guidance on antiestrogen therapy as she is not going to have rxt. Advised I would route her question to Nurse Navigator.

## 2023-02-25 ENCOUNTER — Inpatient Hospital Stay: Payer: Medicare HMO

## 2023-02-25 ENCOUNTER — Telehealth: Payer: Self-pay | Admitting: Hematology and Oncology

## 2023-02-25 NOTE — Telephone Encounter (Signed)
Scheduled appointments per scheduling message. Left voicemail.

## 2023-02-26 ENCOUNTER — Ambulatory Visit (INDEPENDENT_AMBULATORY_CARE_PROVIDER_SITE_OTHER): Payer: Medicare HMO | Admitting: Family

## 2023-02-26 ENCOUNTER — Other Ambulatory Visit: Payer: Self-pay

## 2023-02-26 ENCOUNTER — Encounter: Payer: Self-pay | Admitting: Family

## 2023-02-26 VITALS — BP 160/63 | HR 55 | Temp 97.6°F | Ht 59.0 in | Wt 171.0 lb

## 2023-02-26 DIAGNOSIS — C50812 Malignant neoplasm of overlapping sites of left female breast: Secondary | ICD-10-CM

## 2023-02-26 DIAGNOSIS — I10 Essential (primary) hypertension: Secondary | ICD-10-CM | POA: Diagnosis not present

## 2023-02-26 DIAGNOSIS — E785 Hyperlipidemia, unspecified: Secondary | ICD-10-CM

## 2023-02-26 DIAGNOSIS — K219 Gastro-esophageal reflux disease without esophagitis: Secondary | ICD-10-CM | POA: Diagnosis not present

## 2023-02-26 DIAGNOSIS — J301 Allergic rhinitis due to pollen: Secondary | ICD-10-CM

## 2023-02-26 DIAGNOSIS — E669 Obesity, unspecified: Secondary | ICD-10-CM

## 2023-02-26 DIAGNOSIS — Z0000 Encounter for general adult medical examination without abnormal findings: Secondary | ICD-10-CM

## 2023-02-26 DIAGNOSIS — E039 Hypothyroidism, unspecified: Secondary | ICD-10-CM

## 2023-02-26 DIAGNOSIS — I700 Atherosclerosis of aorta: Secondary | ICD-10-CM

## 2023-02-26 DIAGNOSIS — Z0001 Encounter for general adult medical examination with abnormal findings: Secondary | ICD-10-CM | POA: Diagnosis not present

## 2023-02-26 DIAGNOSIS — Z170 Estrogen receptor positive status [ER+]: Secondary | ICD-10-CM

## 2023-02-26 DIAGNOSIS — C50411 Malignant neoplasm of upper-outer quadrant of right female breast: Secondary | ICD-10-CM

## 2023-02-26 MED ORDER — LEVOTHYROXINE SODIUM 50 MCG PO TABS: 50.0000 ug | ORAL_TABLET | Freq: Every day | ORAL | 3 refills | Status: DC

## 2023-02-26 MED ORDER — AMLODIPINE BESYLATE 5 MG PO TABS
5.0000 mg | ORAL_TABLET | Freq: Every day | ORAL | 3 refills | Status: DC
Start: 2023-02-26 — End: 2023-08-27

## 2023-02-26 MED ORDER — FEXOFENADINE HCL 180 MG PO TABS
180.0000 mg | ORAL_TABLET | Freq: Every day | ORAL | 2 refills | Status: AC
Start: 2023-02-26 — End: ?

## 2023-02-26 MED ORDER — BRIMONIDINE TARTRATE-TIMOLOL 0.2-0.5 % OP SOLN: OPHTHALMIC | 3 refills | Status: DC

## 2023-02-26 MED ORDER — PANTOPRAZOLE SODIUM 40 MG PO TBEC: 40.0000 mg | DELAYED_RELEASE_TABLET | Freq: Every day | ORAL | 3 refills | Status: DC

## 2023-02-26 MED ORDER — METOPROLOL SUCCINATE ER 25 MG PO TB24: 25.0000 mg | ORAL_TABLET | Freq: Every day | ORAL | 3 refills | Status: DC

## 2023-02-26 MED ORDER — FLUTICASONE PROPIONATE 50 MCG/ACT NA SUSP
1.0000 | Freq: Every day | NASAL | 3 refills | Status: DC
Start: 2023-02-26 — End: 2024-03-02

## 2023-02-26 MED ORDER — ROSUVASTATIN CALCIUM 20 MG PO TABS
20.0000 mg | ORAL_TABLET | Freq: Every day | ORAL | 3 refills | Status: DC
Start: 2023-02-26 — End: 2023-12-12

## 2023-02-26 NOTE — Addendum Note (Signed)
Addended by: Jannifer Rodney A on: 02/26/2023 12:12 PM   Modules accepted: Level of Service

## 2023-02-26 NOTE — Patient Instructions (Signed)
Hypertension, Adult High blood pressure (hypertension) is when the force of blood pumping through the arteries is too strong. The arteries are the blood vessels that carry blood from the heart throughout the body. Hypertension forces the heart to work harder to pump blood and may cause arteries to become narrow or stiff. Untreated or uncontrolled hypertension can lead to a heart attack, heart failure, a stroke, kidney disease, and other problems. A blood pressure reading consists of a higher number over a lower number. Ideally, your blood pressure should be below 120/80. The first ("top") number is called the systolic pressure. It is a measure of the pressure in your arteries as your heart beats. The second ("bottom") number is called the diastolic pressure. It is a measure of the pressure in your arteries as the heart relaxes. What are the causes? The exact cause of this condition is not known. There are some conditions that result in high blood pressure. What increases the risk? Certain factors may make you more likely to develop high blood pressure. Some of these risk factors are under your control, including: Smoking. Not getting enough exercise or physical activity. Being overweight. Having too much fat, sugar, calories, or salt (sodium) in your diet. Drinking too much alcohol. Other risk factors include: Having a personal history of heart disease, diabetes, high cholesterol, or kidney disease. Stress. Having a family history of high blood pressure and high cholesterol. Having obstructive sleep apnea. Age. The risk increases with age. What are the signs or symptoms? High blood pressure may not cause symptoms. Very high blood pressure (hypertensive crisis) may cause: Headache. Fast or irregular heartbeats (palpitations). Shortness of breath. Nosebleed. Nausea and vomiting. Vision changes. Severe chest pain, dizziness, and seizures. How is this diagnosed? This condition is diagnosed by  measuring your blood pressure while you are seated, with your arm resting on a flat surface, your legs uncrossed, and your feet flat on the floor. The cuff of the blood pressure monitor will be placed directly against the skin of your upper arm at the level of your heart. Blood pressure should be measured at least twice using the same arm. Certain conditions can cause a difference in blood pressure between your right and left arms. If you have a high blood pressure reading during one visit or you have normal blood pressure with other risk factors, you may be asked to: Return on a different day to have your blood pressure checked again. Monitor your blood pressure at home for 1 week or longer. If you are diagnosed with hypertension, you may have other blood or imaging tests to help your health care provider understand your overall risk for other conditions. How is this treated? This condition is treated by making healthy lifestyle changes, such as eating healthy foods, exercising more, and reducing your alcohol intake. You may be referred for counseling on a healthy diet and physical activity. Your health care provider may prescribe medicine if lifestyle changes are not enough to get your blood pressure under control and if: Your systolic blood pressure is above 130. Your diastolic blood pressure is above 80. Your personal target blood pressure may vary depending on your medical conditions, your age, and other factors. Follow these instructions at home: Eating and drinking  Eat a diet that is high in fiber and potassium, and low in sodium, added sugar, and fat. An example of this eating plan is called the DASH diet. DASH stands for Dietary Approaches to Stop Hypertension. To eat this way: Eat   plenty of fresh fruits and vegetables. Try to fill one half of your plate at each meal with fruits and vegetables. Eat whole grains, such as whole-wheat pasta, brown rice, or whole-grain bread. Fill about one  fourth of your plate with whole grains. Eat or drink low-fat dairy products, such as skim milk or low-fat yogurt. Avoid fatty cuts of meat, processed or cured meats, and poultry with skin. Fill about one fourth of your plate with lean proteins, such as fish, chicken without skin, beans, eggs, or tofu. Avoid pre-made and processed foods. These tend to be higher in sodium, added sugar, and fat. Reduce your daily sodium intake. Many people with hypertension should eat less than 1,500 mg of sodium a day. Do not drink alcohol if: Your health care provider tells you not to drink. You are pregnant, may be pregnant, or are planning to become pregnant. If you drink alcohol: Limit how much you have to: 0-1 drink a day for women. 0-2 drinks a day for men. Know how much alcohol is in your drink. In the [place] one drink equals one 12 oz bottle of beer (355 mL), one 5 oz glass of wine (148 mL), or one 1 oz glass of hard liquor (44 mL). Lifestyle  Work with your health care provider to maintain a healthy body weight or to lose weight. Ask what an ideal weight is for you. Get at least 30 minutes of exercise that causes your heart to beat faster (aerobic exercise) most days of the week. Activities may include walking, swimming, or biking. Include exercise to strengthen your muscles (resistance exercise), such as Pilates or lifting weights, as part of your weekly exercise routine. Try to do these types of exercises for 30 minutes at least 3 days a week. Do not use any products that contain nicotine or tobacco. These products include cigarettes, chewing tobacco, and vaping devices, such as e-cigarettes. If you need help quitting, ask your health care provider. Monitor your blood pressure at home as told by your health care provider. Keep all follow-up visits. This is important. Medicines Take over-the-counter and prescription medicines only as told by your health care provider. Follow directions carefully. Blood  pressure medicines must be taken as prescribed. Do not skip doses of blood pressure medicine. Doing this puts you at risk for problems and can make the medicine less effective. Ask your health care provider about side effects or reactions to medicines that you should watch for. Contact a health care provider if you: Think you are having a reaction to a medicine you are taking. Have headaches that keep coming back (recurring). Feel dizzy. Have swelling in your ankles. Have trouble with your vision. Get help right away if you: Develop a severe headache or confusion. Have unusual weakness or numbness. Feel faint. Have severe pain in your chest or abdomen. Vomit repeatedly. Have trouble breathing. These symptoms may be an emergency. Get help right away. Call 911. Do not wait to see if the symptoms will go away. Do not drive yourself to the hospital. Summary Hypertension is when the force of blood pumping through your arteries is too strong. If this condition is not controlled, it may put you at risk for serious complications. Your personal target blood pressure may vary depending on your medical conditions, your age, and other factors. For most people, a normal blood pressure is less than 120/80. Hypertension is treated with lifestyle changes, medicines, or a combination of both. Lifestyle changes include losing weight, eating a healthy,   low-sodium diet, exercising more, and limiting alcohol. This information is not intended to replace advice given to you by your health care provider. Make sure you discuss any questions you have with your health care provider. Document Revised: 08/22/2021 Document Reviewed: 08/22/2021 Elsevier Patient Education  2023 Elsevier Inc.  

## 2023-02-26 NOTE — Progress Notes (Signed)
Subjective:    Patient ID: Carolyn Parsons, female    DOB: 1932/03/02, 89 y.o.   MRN: [id number]  Chief Complaint  Patient presents with   Medical Management of Chronic Issues   Pt presents to the office today for CPE and chronic follow up. She has chronic back pain,  has seen Pain management for injections that has helped in the past.    She aortic atherosclerosis and takes Crestor 20 mg daily.   She is followed by Oncologists for bilateral breast cancer. She is scheduled for surgery on May 28.  Hypertension This is a chronic problem. The current episode started more than 1 year ago. The problem has been resolved since onset. The problem is controlled. Associated symptoms include malaise/fatigue. Pertinent negatives include no peripheral edema or shortness of breath. Risk factors for coronary artery disease include dyslipidemia and obesity. The current treatment provides moderate improvement. Identifiable causes of hypertension include a thyroid problem.  Gastroesophageal Reflux She complains of belching and heartburn. This is a chronic problem. The current episode started more than 1 year ago. The problem occurs occasionally. Associated symptoms include fatigue. Risk factors include obesity. She has tried a PPI for the symptoms. The treatment provided moderate relief.  Thyroid Problem Presents for follow-up visit. Symptoms include dry skin and fatigue. Patient reports no cold intolerance or depressed mood. The symptoms have been stable. Her past medical history is significant for hyperlipidemia.  Hyperlipidemia This is a chronic problem. The current episode started more than 1 year ago. The problem is controlled. Recent lipid tests were reviewed and are normal. Exacerbating diseases include obesity. Pertinent negatives include no shortness of breath. Current antihyperlipidemic treatment includes statins. The current treatment provides moderate improvement of lipids. Risk factors for coronary  artery disease include dyslipidemia, hypertension, a sedentary lifestyle and post-menopausal.      Review of Systems  Constitutional:  Positive for fatigue and malaise/fatigue.  Respiratory:  Negative for shortness of breath.   Gastrointestinal:  Positive for heartburn.  Endocrine: Negative for cold intolerance.  All other systems reviewed and are negative.  Family History  Problem Relation Age of Onset   Cancer Mother    Cancer Father        Brain    Heart disease Sister    Leukemia Sister    Cancer Brother        Prostate   Hyperlipidemia Daughter    Hypertension Daughter    Heart disease Son    Dementia Sister    Heart disease Son    Hyperlipidemia Son    Hypertension Son    Social History   Socioeconomic History   Marital status: Widowed    Spouse name: Not on file   Number of children: 4   Years of education: Not on file   Highest education level: 8th grade  Occupational History   Occupation: retired    Comment: school bus driver   Tobacco Use   Smoking status: Former    Packs/day: 0.25    Years: 20.00    Additional pack years: 0.00    Total pack years: 5.00    Types: Cigarettes    Quit date: 10/29/1993    Years since quitting: 29.3   Smokeless tobacco: Never  Vaping Use   Vaping Use: Never used  Substance and Sexual Activity   Alcohol use: Never   Drug use: Never   Sexual activity: Not on file  Other Topics Concern   Not on file  Social History Narrative  Not on file   Social Determinants of Health   Financial Resource Strain: Low Risk  (12/01/2018)   Overall Financial Resource Strain (CARDIA)    Difficulty of Paying Living Expenses: Not hard at all  Food Insecurity: No Food Insecurity (12/01/2018)   Hunger Vital Sign    Worried About Running Out of Food in the Last Year: Never true    Ran Out of Food in the Last Year: Never true  Transportation Needs: No Transportation Needs (12/01/2018)   PRAPARE - Administrator, Civil Service  (Medical): No    Lack of Transportation (Non-Medical): No  Physical Activity: Inactive (12/01/2018)   Exercise Vital Sign    Days of Exercise per Week: 0 days    Minutes of Exercise per Session: 0 min  Stress: Not on file  Social Connections: Moderately Isolated (12/01/2018)   Social Connection and Isolation Panel [NHANES]    Frequency of Communication with Friends and Family: Once a week    Frequency of Social Gatherings with Friends and Family: More than three times a week    Attends Religious Services: Never    Database administrator or Organizations: No    Attends Banker Meetings: Never    Marital Status: Widowed       Objective:   Physical Exam Vitals reviewed.  Constitutional:      General: She is not in acute distress.    Appearance: She is well-developed.  HENT:     Head: Normocephalic and atraumatic.     Right Ear: Tympanic membrane normal.     Left Ear: Tympanic membrane normal.  Eyes:     Pupils: Pupils are equal, round, and reactive to light.  Neck:     Thyroid: No thyromegaly.  Cardiovascular:     Rate and Rhythm: Normal rate and regular rhythm.     Heart sounds: Normal heart sounds. No murmur heard. Pulmonary:     Effort: Pulmonary effort is normal. No respiratory distress.     Breath sounds: Normal breath sounds. No wheezing.  Abdominal:     General: Bowel sounds are normal. There is no distension.     Palpations: Abdomen is soft.     Tenderness: There is no abdominal tenderness.  Musculoskeletal:        General: No tenderness. Normal range of motion.     Cervical back: Normal range of motion and neck supple.     Right lower leg: Edema (trace) present.     Left lower leg: Edema (trace) present.  Skin:    General: Skin is warm and dry.  Neurological:     Mental Status: She is alert and oriented to person, place, and time.     Cranial Nerves: No cranial nerve deficit.     Deep Tendon Reflexes: Reflexes are normal and symmetric.  Psychiatric:         Behavior: Behavior normal.        Thought Content: Thought content normal.        Judgment: Judgment normal.       BP (!) 169/67   Pulse (!) 55   Temp 97.6 F (36.4 C) (Temporal)   Ht 4\' 11"  (1.499 m)   Wt 171 lb (77.6 kg)   SpO2 98%   BMI 34.54 kg/m      Assessment & Plan:  Carolyn Parsons comes in today with chief complaint of Medical Management of Chronic Issues   Diagnosis and orders addressed:  1. Essential hypertension -  amLODipine (NORVASC) 5 MG tablet; Take 1 tablet (5 mg total) by mouth daily.  Dispense: 90 tablet; Refill: 3 - metoprolol succinate (TOPROL-XL) 25 MG 24 hr tablet; Take 1 tablet (25 mg total) by mouth daily.  Dispense: 90 tablet; Refill: 3 - CMP14+EGFR - CBC with Differential/Platelet  2. Allergic rhinitis due to pollen, unspecified seasonality - fexofenadine (ALLEGRA) 180 MG tablet; Take 1 tablet (180 mg total) by mouth daily.  Dispense: 90 tablet; Refill: 2 - fluticasone (FLONASE) 50 MCG/ACT nasal spray; Place 1 spray into both nostrils daily.  Dispense: 48 g; Refill: 3 - CMP14+EGFR - CBC with Differential/Platelet  3. Gastroesophageal reflux disease, unspecified whether esophagitis present - pantoprazole (PROTONIX) 40 MG tablet; Take 1 tablet (40 mg total) by mouth daily.  Dispense: 90 tablet; Refill: 3 - CMP14+EGFR - CBC with Differential/Platelet  4. Hyperlipidemia, unspecified hyperlipidemia type - rosuvastatin (CRESTOR) 20 MG tablet; Take 1 tablet (20 mg total) by mouth daily.  Dispense: 90 tablet; Refill: 3 - CMP14+EGFR - CBC with Differential/Platelet  5. Annual physical exam - CMP14+EGFR - CBC with Differential/Platelet - Lipid panel - TSH  6. Aortic atherosclerosis (HCC) - CMP14+EGFR - CBC with Differential/Platelet  7. Primary hypertension - CMP14+EGFR - CBC with Differential/Platelet  8. Malignant neoplasm of overlapping sites of left breast in female, estrogen receptor positive (HCC) - CMP14+EGFR - CBC with  Differential/Platelet  9. Hypothyroidism, unspecified type - CMP14+EGFR - CBC with Differential/Platelet - TSH  10. Malignant neoplasm of upper-outer quadrant of right breast in female, estrogen receptor positive (HCC) - CMP14+EGFR - CBC with Differential/Platelet  11. Obesity (BMI 30.0-34.9) - CMP14+EGFR - CBC with Differential/Platelet   Labs pending Pt's BP was stable at surgeon, will not adjust at this time.   Health Maintenance reviewed Diet and exercise encouraged  Follow up plan: 6 months    Jannifer Rodney, FNP

## 2023-02-27 ENCOUNTER — Other Ambulatory Visit: Payer: Medicare HMO

## 2023-02-27 ENCOUNTER — Encounter: Payer: Medicare HMO | Admitting: Radiology

## 2023-02-27 LAB — CMP14+EGFR
ALT: 14 IU/L (ref 0–32)
AST: 18 IU/L (ref 0–40)
Albumin/Globulin Ratio: 1.5 (ref 1.2–2.2)
Albumin: 4.1 g/dL (ref 3.7–4.7)
Alkaline Phosphatase: 76 IU/L (ref 44–121)
BUN/Creatinine Ratio: 19 (ref 12–28)
BUN: 16 mg/dL (ref 8–27)
Bilirubin Total: 0.6 mg/dL (ref 0.0–1.2)
CO2: 23 mmol/L (ref 20–29)
Calcium: 9.5 mg/dL (ref 8.7–10.3)
Chloride: 103 mmol/L (ref 96–106)
Creatinine, Ser: 0.86 mg/dL (ref 0.57–1.00)
Globulin, Total: 2.8 g/dL (ref 1.5–4.5)
Glucose: 100 mg/dL — ABNORMAL HIGH (ref 70–99)
Potassium: 4.8 mmol/L (ref 3.5–5.2)
Sodium: 143 mmol/L (ref 134–144)
Total Protein: 6.9 g/dL (ref 6.0–8.5)
eGFR: 65 mL/min/{1.73_m2} (ref >59)

## 2023-02-27 LAB — CBC WITH DIFFERENTIAL/PLATELET
Basophils Absolute: 0 10*3/uL (ref 0.0–0.2)
Basos: 0 %
EOS (ABSOLUTE): 0.2 10*3/uL (ref 0.0–0.4)
Eos: 2 %
Hematocrit: 43.8 % (ref 34.0–46.6)
Hemoglobin: 14.3 g/dL (ref 11.1–15.9)
Immature Grans (Abs): 0 10*3/uL (ref 0.0–0.1)
Immature Granulocytes: 0 %
Lymphocytes Absolute: 2.4 10*3/uL (ref 0.7–3.1)
Lymphs: 31 %
MCH: 30 pg (ref 26.6–33.0)
MCHC: 32.6 g/dL (ref 31.5–35.7)
MCV: 92 fL (ref 79–97)
Monocytes Absolute: 0.6 10*3/uL (ref 0.1–0.9)
Monocytes: 7 %
Neutrophils Absolute: 4.4 10*3/uL (ref 1.4–7.0)
Neutrophils: 60 %
Platelets: 185 10*3/uL (ref 150–450)
RBC: 4.77 x10E6/uL (ref 3.77–5.28)
RDW: 12.6 % (ref 11.7–15.4)
WBC: 7.6 10*3/uL (ref 3.4–10.8)

## 2023-02-27 LAB — TSH: TSH: 3 u[IU]/mL (ref 0.450–4.500)

## 2023-02-27 LAB — LIPID PANEL
Chol/HDL Ratio: 3.6 ratio (ref 0.0–4.4)
Cholesterol, Total: 137 mg/dL (ref 100–199)
HDL: 38 mg/dL — ABNORMAL LOW (ref >39)
LDL Chol Calc (NIH): 78 mg/dL (ref 0–99)
Triglycerides: 117 mg/dL (ref 0–149)
VLDL Cholesterol Cal: 21 mg/dL (ref 5–40)

## 2023-03-05 ENCOUNTER — Inpatient Hospital Stay: Payer: Medicare HMO | Attending: Hematology and Oncology | Admitting: Radiology

## 2023-03-05 ENCOUNTER — Inpatient Hospital Stay: Payer: Medicare HMO

## 2023-03-05 ENCOUNTER — Other Ambulatory Visit: Payer: Self-pay

## 2023-03-05 DIAGNOSIS — C50812 Malignant neoplasm of overlapping sites of left female breast: Secondary | ICD-10-CM

## 2023-03-05 DIAGNOSIS — Z170 Estrogen receptor positive status [ER+]: Secondary | ICD-10-CM

## 2023-03-05 LAB — RESEARCH LABS

## 2023-03-05 NOTE — Research (Addendum)
[employer] 2021-05 - Specimen Collection Study to Evaluate Biomarkers in Subjects with Cancer    Patient Carolyn Parsons was identified by Dr. Pamelia Hoit  as a potential candidate for the above listed study.  This Clinical Research Coordinator met with Carolyn Parsons, [id number] on 03/05/23 in a manner and location that ensures patient privacy to discuss participation in the above listed research study.  Patient is Accompanied by Daughter  .  Patient was previously provided with informed consent documents.  Patient confirmed they have read the informed consent documents.  As outlined in the informed consent form, this Coordinator and Carolyn Parsons discussed the purpose of the research study, the investigational nature of the study, study procedures and requirements for study participation, potential risks and benefits of study participation, as well as alternatives to participation.  This study is not blinded or double-blinded. The patient understands participation is voluntary and they may withdraw from study participation at any time.  This study does not involve randomization.  This study does not involve an investigational drug or device. This study does not involve a placebo. Patient understands enrollment is pending full eligibility review.   Confidentiality and how the patient's information will be used as part of study participation were discussed.  Patient was informed there is not reimbursement provided for their time and effort spent on trial participation.  The patient is encouraged to discuss research study participation with their insurance provider to determine what costs they may incur as part of study participation, including research related injury.    All questions were answered to patient's satisfaction.  The informed consent with embedded HIPAA language was reviewed page by page.  The patient's mental and emotional status is appropriate to provide informed consent, and the patient  verbalizes an understanding of study participation.  Patient has agreed to participate in the above listed research study and has voluntarily signed the informed consent version [employer] Approved Version 11 Nov 2020 with embedded HIPAA language, version 3  on 03/05/23 at 8:00AM.  The patient was provided with a copy of the signed informed consent form with embedded HIPAA language for their reference.  No study specific procedures were obtained prior to the signing of the informed consent document.  Approximately 30 minutes were spent with the patient reviewing the informed consent documents.  Patient was not requested to complete a Release of Information form.   This Coordinator has reviewed this patient's inclusion and exclusion criteria and confirmed Carolyn Parsons is eligible for study participation.  Patient will continue with enrollment.  Menopausal status (women only): Carolyn Parsons is Postmenopausal   Eligibility confirmed by treating investigator, who also agrees that patient should proceed with enrollment.  Medical History:  High Blood Pressure  Yes Coronary Artery Disease No Lupus    No Rheumatoid Arthritis  No Diabetes   No      If yes, which type?      N/a Lynch Syndrome  No  Is the patient currently taking a magnesium supplement?   No If yes, dose and frequency? N/a Indication? N/a Start date? N/a  Does the patient have a personal history of cancer (greater than 5 years ago)?  No If yes, Cancer type and date of diagnosis?   N/a  Has this previous diagnosis been treated? N/a      If so, treatment type? N/a   Start and end dates of last treatment cycle? N/a  Does the patient have a family history of cancer in 1st or 2nd degree  relatives? Yes If yes, Relationship(s) and Cancer type(s)? Father- brain, Mother- Liver   Does the patient have history of alcohol consumption? Yes   If yes, current or former? Current  If former, year stopped? N/a Number of years? 68 Drinks per  week? 1   Does the patient have history of cigarette, cigar, pipe, or chewing tobacco use?  Yes  If yes, current for former? Former  If yes, type (Cigarette, cigar, pipe, and/or chewing tobacco)? Cigarettes    If former, year stopped? 1995 Number of years? 20 years  Packs/number/containers per day? 1  Carolyn Parsons, [facility] 03/05/2023 8:12 AM

## 2023-03-11 NOTE — Progress Notes (Signed)
Location of Breast Cancer:  Malignant neoplasm of upper-outer quadrant of right breast in female, estrogen receptor positive [facility])  Histology per Pathology Report:  01-25-23     02-21-23   Receptor Status:  -Right ER(95%), PR (20%), Her2-neu (neg), Ki-67() - Left ER (), PR (100%), Her2-neu (neg), Ki-67 (5%)  Did patient present with symptoms (if so, please note symptoms) or was this found on screening mammography?: palpated right breast mass  Past/Anticipated interventions by surgeon, if any:  03/26/2023 --Dr. [name]  Scheduled for:  RIGHT BREAST SEED BRACKETED LUMPECTOMY LEFT BREAST SEED BRACKETED LUMPECTOMY   Dr. Dwain Sarna note on 02-20-23 Assessment and Plan:   Diagnoses and all orders for this visit:  Malignant neoplasm of central portion of both breasts in female, estrogen receptor positive (CMS/HHS-HCC)   Bilateral seed bracketed lumpectomies (pending another left breast biopsy)  We discussed the staging and pathophysiology of breast cancer. We discussed all of the different options for treatment for breast cancer including surgery, chemotherapy, radiation therapy, Herceptin, and antiestrogen therapy.  She asked about no surgery. This would be reasonable I think if she were not so healthy. Has multiple family members who lived over 72.   I do not think she needs lymph node biopsies.  We discussed the options for treatment of the breast cancer which included lumpectomy versus a mastectomy. We discussed the performance of the lumpectomy with radioactive seed placement. We discussed a 5-10% chance of a positive margin requiring reexcision in the operating room. We also discussed that she will likely need radiation therapy if she undergoes lumpectomy. We discussed mastectomy and the postoperative care for that as well. Mastectomy can be followed by reconstruction. The decision for lumpectomy vs mastectomy has no impact on decision for chemotherapy. Most mastectomy  patients will not need radiation therapy. We discussed that there is no difference in her survival whether she undergoes lumpectomy with radiation therapy or antiestrogen therapy versus a mastectomy. There is also no real difference between her recurrence in the breast.  I think we can do bracketed lumpectomies on both sides. I will schedule her but needs one additional left sided biopsy to be complete.  We discussed the risks of operation including bleeding, infection, possible reoperation. She understands her further therapy will be based on what her stages at the time of her operation.   Past/Anticipated interventions by medical oncology, if any:  Dr. Pamelia Hoit on 02-20-23 ASSESSMENT AND PLAN:  Malignant neoplasm of upper-outer quadrant of right breast in female, estrogen receptor positive 01/25/2023: Palpable abnormality right breast retroareolar 11 o'clock position: 1.4 cm, adjacent mass 0.4 cm, axilla negative: Biopsy grade 2 IDC ER 95%, PR 20%, HER2 2+ by IHC FISH negative Left breast spiculated mass 7 mm at 1:00, 1.6 cm at 12:00, 2 masses at 11 o'clock position biopsy revealed grade 2 invasive mammary cancer with papillary features ER?,  PR 100%, Ki67 5%, HER2 2+ by IHC, FISH negative 0.7 cm (benign) and 0.5 cm (not biopsied)   Pathology and radiology counseling: Discussed with the patient, the details of pathology including the type of breast cancer,the clinical staging, the significance of ER, PR and HER-2/neu receptors and the implications for treatment. After reviewing the pathology in detail, we proceeded to discuss the different treatment options between surgery, radiation,  antiestrogen therapies.   Patient is in excellent physical condition.   Treatment plan: Bilateral lumpectomies +/- Adjuvant radiation Antiestrogen therapy   Return to clinic after surgery to discuss pathology report  Lymphedema issues, if any:  Denies   Pain issues, if any:  Denies breast pain except at biopsy  site when palpated   SAFETY ISSUES: Prior radiation? No Pacemaker/ICD? No Possible current pregnancy? No--postmenopausal  Is the patient on methotrexate? No  Current Complaints / other details:  Nothing else of note

## 2023-03-19 ENCOUNTER — Encounter (HOSPITAL_BASED_OUTPATIENT_CLINIC_OR_DEPARTMENT_OTHER): Payer: Self-pay | Admitting: General Surgery

## 2023-03-19 ENCOUNTER — Other Ambulatory Visit: Payer: Self-pay

## 2023-03-19 NOTE — Progress Notes (Signed)
   03/19/23 0915  Pre-op Phone Call  Surgery Date Verified 03/26/23  Arrival Time Verified 0600  Surgery Location Verified [facility]  Medical History Reviewed Yes  Is the patient taking a GLP-1 receptor agonist? No  Does the patient have diabetes? No diagnosis of diabetes  Do you have a history of heart problems? No  Antiarrhythmic device type  (NA)  Does patient have other implanted devices? No  Patient Teaching Pre / Post Procedure  Patient educated about smoking cessation 24 hours prior to surgery. N/A Non-Smoker  Patient verbalizes understanding of bowel prep? N/A  Med Rec Completed Yes  Take the Following Meds the Morning of Surgery amlodipine, allegra, flonase, synthroid, metoprolol, and protonix;  Recent  Lab Work, EKG, CXR? No  NPO (Including gum & candy) 4 hrs before  Allowed clear liquids Water  Patient instructed to stop clear liquids including Carb loading drink at: 0330  Stop Solids, Milk, Candy, and Gum STARTING AT MIDNIGHT  Responsible adult to drive and be with you for 24 hours? Yes  Name & Phone Number for Ride/Caregiver daughter, Richarda Osmond  No Burton Apley, money, nail polish or make-up.  No lotions, powders, perfumes. No shaving  48 hrs. prior to surgery. Yes  Contacts, Dentures & Glasses Will Have to be Removed Before OR. Yes  Please bring your ID and Insurance Card the morning of your surgery. (Surgery Centers Only) Yes  Instructed to contact the location of procedure/ provider if they or anyone in their household develops symptoms or tests positive for COVID-19, has close contact with someone who tests positive for COVID, or has known exposure to any contagious illness. Yes  Call this number the morning of surgery  with any problems that may cancel your surgery. [phone]

## 2023-03-21 NOTE — Progress Notes (Signed)
Radiation Oncology         [phone] ________________________________  Initial Outpatient Consultation  Name: Carolyn Parsons MRN: [id number]  Date: 03/22/2023  DOB: 11/22/1931  WU:[id number], Edilia Bo, FNP  Serena Croissant, MD   REFERRING PHYSICIAN: Serena Croissant, MD  DIAGNOSIS:    ICD-10-CM   1. Malignant neoplasm of upper-outer quadrant of right breast in female, estrogen receptor positive (HCC)  C50.411    Z17.0     2. Malignant neoplasm of overlapping sites of left breast in female, estrogen receptor positive (HCC)  C50.812    Z17.0        Cancer Staging  No matching staging information was found for the patient.  Bilateral Breast Cancers:  -- Stage IA Right Breast UOQ, Invasive ductal carcinoma, ER+ / PR+ / Her2-, Grade 2 -- Stage IA Overlapping sites of the Left Breast, Invasive mammary carcinoma with papillary features, ER not assessed / PR+ / Her2-, Grade 2   CHIEF COMPLAINT: Here to discuss management of bilateral breast cancers  HISTORY OF PRESENT ILLNESS::Carolyn Parsons is a 89 y.o. female who presented with a palpable right breast lump this past March. She subsequently presented for a bilateral diagnostic mammogram and bilateral breast ultrasound on 01/22/23 which demonstrated: a highly suspicious 1.4 cm mass in the 11 o'clock right breast; a tiny mass measuring 4 mm located adjacent to the 11 o'clock mass; a highly suspicious 1.6 cm mass in the 12 o'clock left breast; and 3 other oval masses in the left breast which were thought to possibly represent cysts (2 of which located in the 11 o'clock left breast). [place] otherwise showed no evidence of axillary lymphadenopathy bilaterally.  Biopsies collected on 01/25/23 are detailed as follows:  -- Biopsy of the 11 o'clock right breast 1 cmfn showed grade 2 invasive ductal carcinoma measuring 1.1 cm in the greatest linear extent of the sample, and calcifications. ER status: 95% positive and PR status 20% positive, both with strong  staining intensity; Her2 status negative; Grade 2 -- Biopsy of the smaller 11 o'clock right breast mass showed no evidence of malignancy and findings consistent with UDH.  -- Biopsies of the 12 o'clock and 1 o'clock left breast both showed grade 2 invasive mammary carcinoma with papillary features measuring 3 mm and 5 mm  in the greatest linear extent of the samples (respectively). ER status not assessed (reason for this unknown); PR status 100% positive with strong staining intensity; Proliferation marker Ki67 at 5%; Her2 status negative; Grade 2 -- No lymph nodes were examined.   Accordingly, the patient was referred to Dr. Magnus Ivan on 02/20/23 to discuss treatment options. After having a very detailed discussion with Dr. Magnus Ivan, the patient has opted to proceed with bilateral breast conserving surgeries. Dr. Magnus Ivan also recommended having a biopsy of the 11 o'clock left breast based on US findings which noted several indeterminate masses in that area.   Subsequent biopsy of the 11 o'clock left breast (2 cmfn) on 02/21/23 showed no evidence of malignancy and findings consistent with UDH.   Her case was also presented at the multidisciplinary tumor board held on 02/20/23 and she met with Dr. Pamelia Hoit on that date to discuss adjuvant treatment options. Based on tumor board consensus, the patient is agreeable to proceed with bilateral lumpectomies as noted above. This will be followed by XRT depending on final pathology, and then by antiestrogen therapy given her ER positive status.   Of note: Dr. Magnus Ivan does not think that SLN evaluation is necessary at  this time.   Lymphedema issues, if any:  Denies   Pain issues, if any:  Denies breast pain except at biopsy site when palpated   SAFETY ISSUES: Prior radiation? No Pacemaker/ICD? No Possible current pregnancy? No--postmenopausal  Is the patient on methotrexate? No  Current Complaints / other details:  Nothing else of note     PREVIOUS  RADIATION THERAPY: No  PAST MEDICAL HISTORY:  has a past medical history of Cataract, GERD (gastroesophageal reflux disease), Hyperlipidemia, and Hypertension.    PAST SURGICAL HISTORY: Past Surgical History:  Procedure Laterality Date   APPENDECTOMY     BLADDER REPAIR     BREAST BIOPSY Left 01/25/2023   [place] LT BREAST BX W LOC DEV EA ADD LESION IMG BX SPEC US GUIDE 01/25/2023 [facility]   BREAST BIOPSY Left 01/25/2023   [place] LT BREAST BX W LOC DEV 1ST LESION IMG BX SPEC US GUIDE 01/25/2023 [facility]   BREAST BIOPSY Left 01/25/2023   [place] LT BREAST BX W LOC DEV EA ADD LESION IMG BX SPEC US GUIDE 01/25/2023 [facility]   BREAST BIOPSY Right 01/25/2023   [place] RT BREAST BX W LOC DEV 1ST LESION IMG BX SPEC US GUIDE 01/25/2023 [facility]   BREAST BIOPSY Left 02/21/2023   [place] LT BREAST BX W LOC DEV 1ST LESION IMG BX SPEC US GUIDE 02/21/2023 [facility]   BREAST BIOPSY Left 03/22/2023   [place] LT RADIOACTIVE SEED EA ADD LESION 03/22/2023 [facility]   BREAST BIOPSY  03/22/2023   [place] RT RADIOACTIVE SEED LOC 03/22/2023 [facility]   BREAST BIOPSY Right 03/22/2023   [place] RT RADIOACTIVE SEED EA ADD LESION 03/22/2023 [facility]   BREAST BIOPSY Left 03/22/2023   [place] LT RADIOACTIVE SEED LOC 03/22/2023 [facility]   CATARACT EXTRACTION, BILATERAL     CHOLECYSTECTOMY      FAMILY HISTORY: family history includes Cancer in her brother, father, and mother; Dementia in her sister; Heart disease in her sister, son, and son; Hyperlipidemia in her daughter and son; Hypertension in her daughter and son; Leukemia in her sister.  SOCIAL HISTORY:  reports that she quit smoking about 29 years ago. Her smoking use included cigarettes. She has a 5.00 pack-year smoking history. She has never used smokeless tobacco. She reports that she does not drink alcohol and does not use drugs.  ALLERGIES: Amoxicillin and Penicillins  MEDICATIONS:  Current Outpatient Medications   Medication Sig Dispense Refill   amLODipine (NORVASC) 5 MG tablet Take 1 tablet (5 mg total) by mouth daily. 90 tablet 3   brimonidine-timolol (COMBIGAN) 0.2-0.5 % ophthalmic solution INSTILL 1 DROP INTO BOTH  EYES EVERY 12 HOURS 25 mL 3   fexofenadine (ALLEGRA) 180 MG tablet Take 1 tablet (180 mg total) by mouth daily. 90 tablet 2   fluticasone (FLONASE) 50 MCG/ACT nasal spray Place 1 spray into both nostrils daily. 48 g 3   levothyroxine (SYNTHROID) 50 MCG tablet Take 1 tablet (50 mcg total) by mouth daily. 90 tablet 3   metoprolol succinate (TOPROL-XL) 25 MG 24 hr tablet Take 1 tablet (25 mg total) by mouth daily. 90 tablet 3   pantoprazole (PROTONIX) 40 MG tablet Take 1 tablet (40 mg total) by mouth daily. 90 tablet 3   rosuvastatin (CRESTOR) 20 MG tablet Take 1 tablet (20 mg total) by mouth daily. 90 tablet 3   No current facility-administered medications for this encounter.   Facility-Administered Medications Ordered in Other Encounters  Medication Dose Route Frequency Provider Last  Rate Last Admin   Chlorhexidine Gluconate Cloth 2 % PADS 6 each  6 each Topical Once [name], MD       And   Chlorhexidine Gluconate Cloth 2 % PADS 6 each  6 each Topical Once [name], MD       Melene Muller ON 03/23/2023] feeding supplement (ENSURE PRE-SURGERY) liquid 296 mL  296 mL Oral Once [name], MD        REVIEW OF SYSTEMS: As above in HPI.   PHYSICAL EXAM:  height is 4\' 11"  (1.499 m) and weight is 171 lb 4 oz (77.7 kg). Her temporal temperature is 97.3 F (36.3 C) (abnormal). Her blood pressure is 179/64 (abnormal) and her pulse is 66. Her respiration is 18 and oxygen saturation is 96%.   General: Alert and oriented, in no acute distress HEENT: Head is normocephalic. Extraocular movements are intact.   Heart: Regular in rate and rhythm with a systolic murmur appreciated  Chest: Clear to auscultation bilaterally, with no rhonchi, wheezes, or rales. Abdomen: Soft,  nontender, nondistended, with no rigidity or guarding. Extremities: No cyanosis or edema. Skin: No concerning lesions. Musculoskeletal: symmetric strength and muscle tone throughout. Neurologic: Cranial nerves II through XII are grossly intact. No obvious focalities. Speech is fluent. Coordination is intact. Psychiatric: Judgment and insight are intact. Affect is appropriate. Breasts: central right breast mass, approx 1.5cm . No other palpable masses appreciated in the breasts or axillae b/l .   ECOG = 1  0 - Asymptomatic (Fully active, able to carry on all predisease activities without restriction)  1 - Symptomatic but completely ambulatory (Restricted in physically strenuous activity but ambulatory and able to carry out work of a light or sedentary nature. For example, light housework, office work)  2 - Symptomatic, <50% in bed during the day (Ambulatory and capable of all self care but unable to carry out any work activities. Up and about more than 50% of waking hours)  3 - Symptomatic, >50% in bed, but not bedbound (Capable of only limited self-care, confined to bed or chair 50% or more of waking hours)  4 - Bedbound (Completely disabled. Cannot carry on any self-care. Totally confined to bed or chair)  5 - Death   Santiago Glad MM, Creech RH, Tormey DC, et al. [phone]). "Toxicity and response criteria of the [facility] Group". Am. Evlyn Clines. Oncol. 5 (6): 649-55   LABORATORY DATA:  Lab Results  Component Value Date   WBC 7.6 02/26/2023   HGB 14.3 02/26/2023   HCT 43.8 02/26/2023   MCV 92 02/26/2023   PLT 185 02/26/2023   CMP     Component Value Date/Time   NA 143 02/26/2023 1148   K 4.8 02/26/2023 1148   CL 103 02/26/2023 1148   CO2 23 02/26/2023 1148   GLUCOSE 100 (H) 02/26/2023 1148   BUN 16 02/26/2023 1148   CREATININE 0.86 02/26/2023 1148   CALCIUM 9.5 02/26/2023 1148   PROT 6.9 02/26/2023 1148   ALBUMIN 4.1 02/26/2023 1148   AST 18 02/26/2023 1148   ALT  14 02/26/2023 1148   ALKPHOS 76 02/26/2023 1148   BILITOT 0.6 02/26/2023 1148   GFRNONAA 61 11/17/2020 1121   GFRAA 70 11/17/2020 1121         RADIOGRAPHY: MM CLIP PLACEMENT RIGHT  Result Date: 03/22/2023 CLINICAL DATA:  Mammogram status post bilateral radioactive seed placements. EXAM: DIAGNOSTIC BILATERAL MAMMOGRAM POST ULTRASOUND-GUIDED RADIOACTIVE SEED PLACEMENT COMPARISON:  Previous exam(s). FINDINGS: Mammographic images were obtained following  ultrasound-guided radioactive seed placement. In the right breast, the 2 seeds are located approximately 2.6 cm apart. There is a coil shaped biopsy marking clip between these seeds within the dominant malignant mass that was previously biopsied. In the left breast, the 2 seeds lie approximately 2 cm apart, but the masses, coil shaped and spring shaped clips and seeds all together span approximately 4.8 cm. IMPRESSION: Appropriate location of the bilateral radioactive seeds. Final Assessment: Post Procedure Mammograms for Seed Placement Electronically Signed   By: Frederico Hamman M.D.   On: 03/22/2023 14:49   MM CLIP PLACEMENT LEFT  Result Date: 03/22/2023 CLINICAL DATA:  Mammogram status post bilateral radioactive seed placements. EXAM: DIAGNOSTIC BILATERAL MAMMOGRAM POST ULTRASOUND-GUIDED RADIOACTIVE SEED PLACEMENT COMPARISON:  Previous exam(s). FINDINGS: Mammographic images were obtained following ultrasound-guided radioactive seed placement. In the right breast, the 2 seeds are located approximately 2.6 cm apart. There is a coil shaped biopsy marking clip between these seeds within the dominant malignant mass that was previously biopsied. In the left breast, the 2 seeds lie approximately 2 cm apart, but the masses, coil shaped and spring shaped clips and seeds all together span approximately 4.8 cm. IMPRESSION: Appropriate location of the bilateral radioactive seeds. Final Assessment: Post Procedure Mammograms for Seed Placement Electronically Signed    By: Frederico Hamman M.D.   On: 03/22/2023 14:49   [place] RT RADIOACTIVE SEED LOC  Result Date: 03/22/2023 CLINICAL DATA:  89 year old female presenting for radioactive seed localization of 2 sites in the right breast and 2 sites in the left breast. EXAM: ULTRASOUND GUIDED RADIOACTIVE SEED LOCALIZATION OF THE BILATERAL BREAST COMPARISON:  Previous exam(s). FINDINGS: Patient presents for radioactive seed localization prior to bilateral lumpectomies. I met with the patient and we discussed the procedure of seed localization including benefits and alternatives. We discussed the high likelihood of a successful procedure. We discussed the risks of the procedure including infection, bleeding, tissue injury and further surgery. We discussed the low dose of radioactivity involved in the procedure. Informed, written consent was given. The usual time-out protocol was performed immediately prior to the procedure. RIGHT BREAST: Using ultrasound guidance, sterile technique, 1% lidocaine and an I-125 radioactive seed, a small mass at the inferolateral margin of the dominant cancer in the right breast at 11 o'clock was localized using an inferior approach. Follow-up survey of the patient confirms presence of the radioactive seed. Order number of I-125 seed:  [id number]. Total activity:  0.260 millicuries reference Date: 02/27/2023 Using ultrasound guidance, sterile technique, 1% lidocaine and an I-125 radioactive seed, a small mass at the medial margin of the dominant cancer in the right breast at 1 o'clock was localized using an inferior approach. Follow-up survey of the patient confirms presence of the radioactive seed. Order number of I-125 seed:  [id number]. Total activity:  0.260 millicuries reference Date: 02/27/2023 LEFT BREAST: Using ultrasound guidance, sterile technique, 1% lidocaine and an I-125 radioactive seed, the mass in the left breast at 12 o'clock, 2 cm from the nipple was localized using a lateral approach.  Follow-up survey of the patient confirms presence of the radioactive seed. Order number of I-125 seed:  [id number]. Total activity:  0.260 millicuries reference Date: 02/27/2023 Using ultrasound guidance, sterile technique, 1% lidocaine and an I-125 radioactive seed, the small mass in the left breast at 1 o'clock, 2 cm from the nipple was localized using a lateral approach. Follow-up survey of the patient confirms presence of the radioactive seed. Order number of I-125 seed:  [id number]. Total activity:  0.260 millicuries reference Date: 02/27/2023 The follow-up mammogram images confirm the seed in the expected location and were marked for Dr. Dwain Sarna. The patient tolerated the procedure well and was released from the [facility]. She was given instructions regarding seed removal. IMPRESSION: Radioactive seed localization bilateral breast (2 sites each breast). No apparent complications. Electronically Signed   By: Frederico Hamman M.D.   On: 03/22/2023 14:43   [place] RT RADIOACTIVE SEED EA ADD LESION  Result Date: 03/22/2023 CLINICAL DATA:  89 year old female presenting for radioactive seed localization of 2 sites in the right breast and 2 sites in the left breast. EXAM: ULTRASOUND GUIDED RADIOACTIVE SEED LOCALIZATION OF THE BILATERAL BREAST COMPARISON:  Previous exam(s). FINDINGS: Patient presents for radioactive seed localization prior to bilateral lumpectomies. I met with the patient and we discussed the procedure of seed localization including benefits and alternatives. We discussed the high likelihood of a successful procedure. We discussed the risks of the procedure including infection, bleeding, tissue injury and further surgery. We discussed the low dose of radioactivity involved in the procedure. Informed, written consent was given. The usual time-out protocol was performed immediately prior to the procedure. RIGHT BREAST: Using ultrasound guidance, sterile technique, 1% lidocaine and an I-125  radioactive seed, a small mass at the inferolateral margin of the dominant cancer in the right breast at 11 o'clock was localized using an inferior approach. Follow-up survey of the patient confirms presence of the radioactive seed. Order number of I-125 seed:  [id number]. Total activity:  0.260 millicuries reference Date: 02/27/2023 Using ultrasound guidance, sterile technique, 1% lidocaine and an I-125 radioactive seed, a small mass at the medial margin of the dominant cancer in the right breast at 1 o'clock was localized using an inferior approach. Follow-up survey of the patient confirms presence of the radioactive seed. Order number of I-125 seed:  [id number]. Total activity:  0.260 millicuries reference Date: 02/27/2023 LEFT BREAST: Using ultrasound guidance, sterile technique, 1% lidocaine and an I-125 radioactive seed, the mass in the left breast at 12 o'clock, 2 cm from the nipple was localized using a lateral approach. Follow-up survey of the patient confirms presence of the radioactive seed. Order number of I-125 seed:  [id number]. Total activity:  0.260 millicuries reference Date: 02/27/2023 Using ultrasound guidance, sterile technique, 1% lidocaine and an I-125 radioactive seed, the small mass in the left breast at 1 o'clock, 2 cm from the nipple was localized using a lateral approach. Follow-up survey of the patient confirms presence of the radioactive seed. Order number of I-125 seed:  [id number]. Total activity:  0.260 millicuries reference Date: 02/27/2023 The follow-up mammogram images confirm the seed in the expected location and were marked for Dr. Dwain Sarna. The patient tolerated the procedure well and was released from the [facility]. She was given instructions regarding seed removal. IMPRESSION: Radioactive seed localization bilateral breast (2 sites each breast). No apparent complications. Electronically Signed   By: Frederico Hamman M.D.   On: 03/22/2023 14:43   [place] LT RADIOACTIVE SEED  LOC  Result Date: 03/22/2023 CLINICAL DATA:  89 year old female presenting for radioactive seed localization of 2 sites in the right breast and 2 sites in the left breast. EXAM: ULTRASOUND GUIDED RADIOACTIVE SEED LOCALIZATION OF THE BILATERAL BREAST COMPARISON:  Previous exam(s). FINDINGS: Patient presents for radioactive seed localization prior to bilateral lumpectomies. I met with the patient and we discussed the procedure of seed localization including benefits and alternatives. We discussed the high likelihood of a successful procedure. We discussed the risks  of the procedure including infection, bleeding, tissue injury and further surgery. We discussed the low dose of radioactivity involved in the procedure. Informed, written consent was given. The usual time-out protocol was performed immediately prior to the procedure. RIGHT BREAST: Using ultrasound guidance, sterile technique, 1% lidocaine and an I-125 radioactive seed, a small mass at the inferolateral margin of the dominant cancer in the right breast at 11 o'clock was localized using an inferior approach. Follow-up survey of the patient confirms presence of the radioactive seed. Order number of I-125 seed:  [id number]. Total activity:  0.260 millicuries reference Date: 02/27/2023 Using ultrasound guidance, sterile technique, 1% lidocaine and an I-125 radioactive seed, a small mass at the medial margin of the dominant cancer in the right breast at 1 o'clock was localized using an inferior approach. Follow-up survey of the patient confirms presence of the radioactive seed. Order number of I-125 seed:  [id number]. Total activity:  0.260 millicuries reference Date: 02/27/2023 LEFT BREAST: Using ultrasound guidance, sterile technique, 1% lidocaine and an I-125 radioactive seed, the mass in the left breast at 12 o'clock, 2 cm from the nipple was localized using a lateral approach. Follow-up survey of the patient confirms presence of the radioactive seed. Order  number of I-125 seed:  [id number]. Total activity:  0.260 millicuries reference Date: 02/27/2023 Using ultrasound guidance, sterile technique, 1% lidocaine and an I-125 radioactive seed, the small mass in the left breast at 1 o'clock, 2 cm from the nipple was localized using a lateral approach. Follow-up survey of the patient confirms presence of the radioactive seed. Order number of I-125 seed:  [id number]. Total activity:  0.260 millicuries reference Date: 02/27/2023 The follow-up mammogram images confirm the seed in the expected location and were marked for Dr. Dwain Sarna. The patient tolerated the procedure well and was released from the [facility]. She was given instructions regarding seed removal. IMPRESSION: Radioactive seed localization bilateral breast (2 sites each breast). No apparent complications. Electronically Signed   By: Frederico Hamman M.D.   On: 03/22/2023 14:43   [place] LT RADIOACTIVE SEED EA ADD LESION  Result Date: 03/22/2023 CLINICAL DATA:  89 year old female presenting for radioactive seed localization of 2 sites in the right breast and 2 sites in the left breast. EXAM: ULTRASOUND GUIDED RADIOACTIVE SEED LOCALIZATION OF THE BILATERAL BREAST COMPARISON:  Previous exam(s). FINDINGS: Patient presents for radioactive seed localization prior to bilateral lumpectomies. I met with the patient and we discussed the procedure of seed localization including benefits and alternatives. We discussed the high likelihood of a successful procedure. We discussed the risks of the procedure including infection, bleeding, tissue injury and further surgery. We discussed the low dose of radioactivity involved in the procedure. Informed, written consent was given. The usual time-out protocol was performed immediately prior to the procedure. RIGHT BREAST: Using ultrasound guidance, sterile technique, 1% lidocaine and an I-125 radioactive seed, a small mass at the inferolateral margin of the dominant cancer in the  right breast at 11 o'clock was localized using an inferior approach. Follow-up survey of the patient confirms presence of the radioactive seed. Order number of I-125 seed:  [id number]. Total activity:  0.260 millicuries reference Date: 02/27/2023 Using ultrasound guidance, sterile technique, 1% lidocaine and an I-125 radioactive seed, a small mass at the medial margin of the dominant cancer in the right breast at 1 o'clock was localized using an inferior approach. Follow-up survey of the patient confirms presence of the radioactive seed. Order number of I-125 seed:  [id number]. Total activity:  0.260 millicuries reference Date: 02/27/2023 LEFT BREAST: Using ultrasound guidance, sterile technique, 1% lidocaine and an I-125 radioactive seed, the mass in the left breast at 12 o'clock, 2 cm from the nipple was localized using a lateral approach. Follow-up survey of the patient confirms presence of the radioactive seed. Order number of I-125 seed:  [id number]. Total activity:  0.260 millicuries reference Date: 02/27/2023 Using ultrasound guidance, sterile technique, 1% lidocaine and an I-125 radioactive seed, the small mass in the left breast at 1 o'clock, 2 cm from the nipple was localized using a lateral approach. Follow-up survey of the patient confirms presence of the radioactive seed. Order number of I-125 seed:  [id number]. Total activity:  0.260 millicuries reference Date: 02/27/2023 The follow-up mammogram images confirm the seed in the expected location and were marked for Dr. Dwain Sarna. The patient tolerated the procedure well and was released from the [facility]. She was given instructions regarding seed removal. IMPRESSION: Radioactive seed localization bilateral breast (2 sites each breast). No apparent complications. Electronically Signed   By: Frederico Hamman M.D.   On: 03/22/2023 14:43   [place] LT BREAST BX W LOC DEV 1ST LESION IMG BX SPEC US GUIDE  Addendum Date: 02/22/2023   ADDENDUM REPORT: 02/22/2023  11:32 ADDENDUM: PATHOLOGY revealed: Breast, left, needle core biopsy, 11 o'clock, 2 cmfn, wing shaped clip - BENIGN BREAST WITH FIBROCYSTIC CHANGES INCLUDING STROMAL FIBROSIS AND USUAL DUCT HYPERPLASIA NEGATIVE FOR MICROCALCIFICATIONS NEGATIVE FOR ATYPIA AND CARCINOMA. Pathology results are CONCORDANT with imaging findings, per Dr. Bary Richard. Pathology results and recommendations were discussed with patient's daughter Richarda Osmond) via telephone on 02/22/2023. Patient reported biopsy site doing well with no adverse symptoms, and only slight tenderness at the site. Post biopsy care instructions were reviewed, questions were answered and my direct phone number was provided. Patient was instructed to call [facility] for any additional questions or concerns related to biopsy site. RECOMMENDATION: Patient to follow current treatment plan for recently diagnosed breast cancer. Pathology results reported by Randa Lynn RN on 02/22/2023. Electronically Signed   By: Bary Richard M.D.   On: 02/22/2023 11:32   Result Date: 02/22/2023 CLINICAL DATA:  Patient presents today for an ultrasound-guided core biopsy of a LEFT breast mass at the 11 o'clock axis, 2 cm from the nipple. On 01/25/2023, patient had 3 ultrasound-guided LEFT breast biopsies. These include biopsy-proven invasive ductal carcinoma of the LEFT breast at the 12 o'clock axis, 2 cm from the nipple (COIL shaped clip) and in the LEFT breast at the 1 o'clock axis, 2 cm from the nipple (spiral HydroMARK clip). Patient also as a biopsy-proven invasive carcinoma within the RIGHT breast mass at the 11 o'clock axis, 1 cm from the nipple. Lastly, patient had a benign ultrasound-guided biopsy of a separate mass in the LEFT breast at the 11 o'clock axis, 2 cm from the nipple (heart clip), adjacent to the mass which is to be biopsied today. EXAM: ULTRASOUND GUIDED LEFT BREAST CORE NEEDLE BIOPSY COMPARISON:  Previous exam(s). PROCEDURE: I met with the  patient and we discussed the procedure of ultrasound-guided biopsy, including benefits and alternatives. We discussed the high likelihood of a successful procedure. We discussed the risks of the procedure, including infection, bleeding, tissue injury, clip migration, and inadequate sampling. Informed written consent was given. The usual time-out protocol was performed immediately prior to the procedure. Lesion quadrant: Upper inner quadrant Using sterile technique and 1% Lidocaine as local anesthetic, under direct ultrasound visualization, a 12 gauge spring-loaded device  was used to perform biopsy of the LEFT breast mass at the 11 o'clock axis, 2 cm from the nipple, using a medial approach. At the conclusion of the procedure wing shaped tissue marker clip was deployed into the biopsy cavity. Follow up 2 view mammogram was performed and dictated separately. IMPRESSION: Ultrasound guided biopsy of the LEFT breast mass at the 11 o'clock axis, 2 cm from the nipple. WING shaped clip placed at today's biopsy site. No apparent complications. Electronically Signed: By: Bary Richard M.D. On: 02/21/2023 08:35  MM CLIP PLACEMENT LEFT  Result Date: 02/21/2023 CLINICAL DATA:  Status post ultrasound-guided biopsy of a LEFT breast mass at the 11 o'clock axis, 2 cm from the nipple. WING shaped clip was placed at the site of today's biopsy. Patient had an ultrasound-guided biopsy of a separate LEFT breast mass at the 11 o'clock axis, 2 cm from the nipple, yesterday with placement of a heart clip placed at the biopsy site. EXAM: 3D DIAGNOSTIC LEFT MAMMOGRAM POST ULTRASOUND BIOPSY COMPARISON:  Previous exam(s). FINDINGS: 3D Mammographic images were obtained following ultrasound guided biopsy of the LEFT breast mass at the 11 o'clock axis, 2 cm from nipple. The biopsy marking clip is in expected position at the site of biopsy. IMPRESSION: Appropriate positioning of the WING shaped biopsy marking clip at the site of biopsy in the  upper inner quadrant of the LEFT breast corresponding to the targeted mass at the 11 o'clock axis, 2 cm from the nipple. Final Assessment: Post Procedure Mammograms for Marker Placement Electronically Signed   By: Bary Richard M.D.   On: 02/21/2023 08:33     IMPRESSION/PLAN: b/l breast cancers  For the patient's early stage favorable risk breast cancer, we had a thorough discussion about her options for adjuvant therapy. One option would be antiestrogen therapy as discussed with medical oncology. She would take a pill for approximately 5 years. The more aggressive option would be to pursue both the pill and radiation therapy as adjuvant modalities.   Of note, I discussed the data from the ONEOK al trial in the [place] Journal of Medicine. She understands that tamoxifen compared to radiation plus tamoxifen demonstrated no survival benefit among the women in this study. The women were 70 years or older with stage I estrogen receptor positive breast cancer. Based on this study, I told the patient that her overall life expectancy should not be affected by adding radiotherapy to antiestrogen medication. She understands that the main benefit of  adding radiotherapy to anti estrogen therapy would be a very small but measurable local control benefit (risk of local recurrence to be lowered from ~9% --> ~2% over a decade).  We discussed the fact that radiotherapy only provides a local control benefit while anti-estrogen pills provide systemic coverage. That being said, the risk of systemic failure is relatively low with her type of breast cancer.   We discussed the risks benefits and side effects of radiotherapy. She understands that the side effects would likely include some skin irritation and fatigue during the weeks of radiation. There is a risk of late effects which include but are not necessarily limited to cosmetic changes and rare lung toxicity. I would anticipate delivering approximately 1-4 weeks of  radiotherapy    After a thorough discussion of standard hypofractionation over 3-4 weeks, we discussed 1 week of ultra hypofractionated radiation therapy. I discussed that this approach is a bit less standard than longer regimens.  The Fast Forward trial (1 week) method  has shown a slight increase in normal tissue side effects over the 3 week hypofractionation standard at 5 years of follow-up. This includes effects like induration, and edema.  However, for elderly patients that are keen on the convenience of minimizing the number of procedures/visits to the cancer center, this is a nevertheless a reasonable approach to consider and the vast majority of patients do very well. There is also an option of RT once weekly for 5 weeks that we discussed.   She is enthusiastic about deferring radiation and taking antiestrogen therapy if her disease remains low risk on final pathologic analysis, pending evaluation of tumor size, margin status, and ER status bilaterally.  If radiation therapy is needed, she prefers the 5 fraction option, possibly after a potential summer vacation in [place], though patient is unsure whether she really wants to go to [place].     We spoke about other general acute effects of breast radiation including skin irritation and fatigue as well as breast fibrosis, induration, and /or swelling long term, and much less common late effects including internal organ injury or irritation. We spoke about the latest technology that is used to minimize the risk of late effects for patients undergoing radiotherapy to the breast or chest wall. No guarantees of treatment were given. The patient is enthusiastic about proceeding with treatment.  I look forward to participating in the patient's care if warranted, and am happy to discuss her disposition at upcoming tumor boards.    On date of service, in total, I spent 60 minutes on this encounter. Patient was seen in person.    __________________________________________   Lonie Peak, MD  This document serves as a record of services personally performed by Lonie Peak, MD. It was created on her behalf by Neena Rhymes, a trained medical scribe. The creation of this record is based on the scribe's personal observations and the provider's statements to them. This document has been checked and approved by the attending provider.

## 2023-03-22 ENCOUNTER — Other Ambulatory Visit: Payer: Self-pay | Admitting: General Surgery

## 2023-03-22 ENCOUNTER — Ambulatory Visit
Admission: RE | Admit: 2023-03-22 | Discharge: 2023-03-22 | Disposition: A | Discharge status: Home / Self Care | Payer: Medicare HMO | Source: Ambulatory Visit | Attending: General Surgery | Admitting: General Surgery

## 2023-03-22 ENCOUNTER — Ambulatory Visit
Admission: RE | Admit: 2023-03-22 | Discharge: 2023-03-22 | Disposition: A | Discharge status: Home / Self Care | Payer: Medicare HMO | Source: Ambulatory Visit | Attending: Radiation Oncology | Admitting: Radiation Oncology

## 2023-03-22 ENCOUNTER — Encounter (HOSPITAL_BASED_OUTPATIENT_CLINIC_OR_DEPARTMENT_OTHER)
Admission: RE | Admit: 2023-03-22 | Discharge: 2023-03-22 | Disposition: A | Discharge status: Home / Self Care | Payer: Medicare HMO | Source: Ambulatory Visit | Attending: General Surgery | Admitting: General Surgery

## 2023-03-22 ENCOUNTER — Other Ambulatory Visit: Payer: Self-pay

## 2023-03-22 VITALS — BP 179/64 | HR 66 | Temp 97.3°F | Resp 18 | Ht 59.0 in | Wt 171.2 lb

## 2023-03-22 DIAGNOSIS — Z01810 Encounter for preprocedural cardiovascular examination: Secondary | ICD-10-CM | POA: Insufficient documentation

## 2023-03-22 DIAGNOSIS — C50412 Malignant neoplasm of upper-outer quadrant of left female breast: Secondary | ICD-10-CM | POA: Diagnosis not present

## 2023-03-22 DIAGNOSIS — I10 Essential (primary) hypertension: Secondary | ICD-10-CM | POA: Insufficient documentation

## 2023-03-22 DIAGNOSIS — C50011 Malignant neoplasm of nipple and areola, right female breast: Secondary | ICD-10-CM

## 2023-03-22 DIAGNOSIS — Z170 Estrogen receptor positive status [ER+]: Secondary | ICD-10-CM | POA: Diagnosis not present

## 2023-03-22 DIAGNOSIS — Z806 Family history of leukemia: Secondary | ICD-10-CM | POA: Diagnosis not present

## 2023-03-22 DIAGNOSIS — C50411 Malignant neoplasm of upper-outer quadrant of right female breast: Secondary | ICD-10-CM | POA: Diagnosis not present

## 2023-03-22 DIAGNOSIS — K219 Gastro-esophageal reflux disease without esophagitis: Secondary | ICD-10-CM | POA: Insufficient documentation

## 2023-03-22 DIAGNOSIS — E785 Hyperlipidemia, unspecified: Secondary | ICD-10-CM | POA: Insufficient documentation

## 2023-03-22 DIAGNOSIS — Z79890 Hormone replacement therapy (postmenopausal): Secondary | ICD-10-CM | POA: Diagnosis not present

## 2023-03-22 DIAGNOSIS — C50812 Malignant neoplasm of overlapping sites of left female breast: Secondary | ICD-10-CM | POA: Diagnosis not present

## 2023-03-22 DIAGNOSIS — F17210 Nicotine dependence, cigarettes, uncomplicated: Secondary | ICD-10-CM | POA: Diagnosis not present

## 2023-03-22 DIAGNOSIS — Z809 Family history of malignant neoplasm, unspecified: Secondary | ICD-10-CM | POA: Diagnosis not present

## 2023-03-22 DIAGNOSIS — Z79899 Other long term (current) drug therapy: Secondary | ICD-10-CM | POA: Diagnosis not present

## 2023-03-22 HISTORY — PX: BREAST BIOPSY: SHX20

## 2023-03-22 MED ORDER — CHLORHEXIDINE GLUCONATE CLOTH 2 % EX PADS: 6.0000 | MEDICATED_PAD | Freq: Once | CUTANEOUS | Status: DC

## 2023-03-22 MED ORDER — ENSURE PRE-SURGERY PO LIQD: 296.0000 mL | Freq: Once | ORAL | Status: DC

## 2023-03-22 NOTE — Progress Notes (Signed)

## 2023-03-24 NOTE — Progress Notes (Signed)
Patient Care Team: [name], FNP as PCP - General (Family Medicine) Kathryne Hitch, MD as Consulting Physician (Orthopedic Surgery) Donnelly Angelica, RN as Oncology Nurse Navigator Pershing Proud, RN as Oncology Nurse Navigator Serena Croissant, MD as Consulting Physician (Hematology and Oncology) [name], MD as Consulting Physician (General Surgery) Lonie Peak, MD as Attending Physician (Radiation Oncology)  DIAGNOSIS:  Encounter Diagnosis  Name Primary?   Malignant neoplasm of upper-outer quadrant of right breast in female, estrogen receptor positive (HCC) Yes    SUMMARY OF ONCOLOGIC HISTORY: Oncology History  Malignant neoplasm of upper-outer quadrant of right breast in female, estrogen receptor positive (HCC)  01/25/2023 Initial Diagnosis   Palpable abnormality right breast retroareolar 11 o'clock position: 1.4 cm, adjacent mass 0.4 cm, axilla negative: Biopsy grade 2 IDC ER 95%, PR 20%, HER2 2+ by IHC FISH negative Left breast spiculated mass 7 mm at 1:00, 1.6 cm at 12:00, 2 masses at 11 o'clock position biopsy revealed grade 2 invasive mammary cancer with papillary features ER?,  PR 100%, Ki67 5%, HER2 2+ by IHC, FISH negative 0.7 cm (benign) and 0.5 cm (not biopsied)   03/26/2023 Surgery   Right lumpectomy: Grade 2 IDC 2 cm with high-grade DCIS, margins negative, ER 95%, PR 100%, HER2 negative, Ki-67 10% Left lumpectomy: Grade 2 IDC 2.5 cm, separate satellite focus IDC 0.8 cm, margins negative, lymphovascular invasion identified, ER 95%, PR 100%, HER2 1+ negative, Ki-67 5%     CHIEF COMPLIANT: Follow-up after surgery  INTERVAL HISTORY: Carolyn Parsons is a  89 y.o. female is here because of recent diagnosis of bilateral breast cancers. She presents to the clinic for a follow-up. She reports surgery went well.   ALLERGIES:  is allergic to amoxicillin and penicillins.  MEDICATIONS:  Current Outpatient Medications  Medication Sig Dispense Refill    amLODipine (NORVASC) 5 MG tablet Take 1 tablet (5 mg total) by mouth daily. 90 tablet 3   anastrozole (ARIMIDEX) 1 MG tablet Take 1 tablet (1 mg total) by mouth daily. 90 tablet 3   brimonidine-timolol (COMBIGAN) 0.2-0.5 % ophthalmic solution INSTILL 1 DROP INTO BOTH  EYES EVERY 12 HOURS 25 mL 3   fexofenadine (ALLEGRA) 180 MG tablet Take 1 tablet (180 mg total) by mouth daily. 90 tablet 2   fluticasone (FLONASE) 50 MCG/ACT nasal spray Place 1 spray into both nostrils daily. 48 g 3   levothyroxine (SYNTHROID) 50 MCG tablet Take 1 tablet (50 mcg total) by mouth daily. 90 tablet 3   metoprolol succinate (TOPROL-XL) 25 MG 24 hr tablet Take 1 tablet (25 mg total) by mouth daily. 90 tablet 3   pantoprazole (PROTONIX) 40 MG tablet Take 1 tablet (40 mg total) by mouth daily. 90 tablet 3   rosuvastatin (CRESTOR) 20 MG tablet Take 1 tablet (20 mg total) by mouth daily. 90 tablet 3   No current facility-administered medications for this visit.    PHYSICAL EXAMINATION: ECOG PERFORMANCE STATUS: 1 - Symptomatic but completely ambulatory  Vitals:   04/03/23 0821  BP: (!) 162/50  Pulse: 62  Resp: 18  Temp: 97.9 F (36.6 C)  SpO2: 95%   Filed Weights   04/03/23 0821  Weight: 171 lb 8 oz (77.8 kg)     LABORATORY DATA:  I have reviewed the data as listed    Latest Ref Rng & Units 02/26/2023   11:48 AM 07/17/2022    8:43 AM 12/21/2021    8:34 AM  CMP  Glucose 70 - 99  mg/dL 829  562  130   BUN 8 - 27 mg/dL 16  18  18    Creatinine 0.57 - 1.00 mg/dL 8.65  7.84  6.96   Sodium 134 - 144 mmol/L 143  141  140   Potassium 3.5 - 5.2 mmol/L 4.8  5.2  4.9   Chloride 96 - 106 mmol/L 103  104  102   CO2 20 - 29 mmol/L 23  24  25    Calcium 8.7 - 10.3 mg/dL 9.5  9.2  9.7   Total Protein 6.0 - 8.5 g/dL 6.9  6.9  6.9   Total Bilirubin 0.0 - 1.2 mg/dL 0.6  0.5  0.4   Alkaline Phos 44 - 121 IU/L 76  74  84   AST 0 - 40 IU/L 18  14  19    ALT 0 - 32 IU/L 14  11  13      Lab Results  Component Value Date    WBC 7.6 02/26/2023   HGB 14.3 02/26/2023   HCT 43.8 02/26/2023   MCV 92 02/26/2023   PLT 185 02/26/2023   NEUTROABS 4.4 02/26/2023    ASSESSMENT & PLAN:  Malignant neoplasm of upper-outer quadrant of right breast in female, estrogen receptor positive (HCC) Right lumpectomy: Grade 2 IDC 2 cm with high-grade DCIS, margins negative, ER 95%, PR 100%, HER2 negative, Ki-67 10% Left lumpectomy: Grade 2 IDC 2.5 cm, separate satellite focus IDC 0.8 cm, margins negative, lymphovascular invasion identified, ER 95%, PR 100%, HER2 1+ negative, Ki-67 5%  Pathology counseling: I discussed the final pathology report of the patient provided  a copy of this report. I discussed the margins as well as lymph node surgeries. We also discussed the final staging along with previously performed ER/PR and HER-2/neu testing.  Treatment plan: Adjuvant radiation therapy (will discuss in tumor board): She is going away for 3 months and would like to do radiation after she comes back in September if that is what we recommend.  I sent a message to Dr. Basilio Cairo. Adjuvant antiestrogen therapy: With anastrozole started 04/03/2023  Patient is in excellent physical condition. Return to clinic in 4 months for survivorship care plan visit    No orders of the defined types were placed in this encounter.  The patient has a good understanding of the overall plan. she agrees with it. she will call with any problems that may develop before the next visit here. Total time spent: 30 mins including face to face time and time spent for planning, charting and co-ordination of care   Tamsen Meek, MD 04/03/23    I Janan Ridge am acting as a Neurosurgeon for [employer]  I have reviewed the above documentation for accuracy and completeness, and I agree with the above.

## 2023-03-25 NOTE — Anesthesia Preprocedure Evaluation (Signed)
Anesthesia Evaluation  Patient identified by MRN, date of birth, ID band Patient awake    Reviewed: Allergy & Precautions, NPO status , Patient's Chart, lab work & pertinent test results, reviewed documented beta blocker date and time   History of Anesthesia Complications Negative for: history of anesthetic complications  Airway Mallampati: I  TM Distance: >3 FB Neck ROM: Full    Dental  (+) Dental Advisory Given, Partial Upper, Partial Lower   Pulmonary former smoker   Pulmonary exam normal        Cardiovascular hypertension, Pt. on medications and Pt. on home beta blockers Normal cardiovascular exam     Neuro/Psych negative neurological ROS  negative psych ROS   GI/Hepatic Neg liver ROS,GERD  Medicated and Controlled,,  Endo/Other  Hypothyroidism   Obesity   Renal/GU negative Renal ROS     Musculoskeletal negative musculoskeletal ROS (+)    Abdominal   Peds  Hematology negative hematology ROS (+)   Anesthesia Other Findings   Reproductive/Obstetrics  Breast cancer                              Anesthesia Physical Anesthesia Plan  ASA: 2  Anesthesia Plan: General   Post-op Pain Management: Tylenol PO (pre-op)*   Induction: Intravenous  PONV Risk Score and Plan: 3 and Treatment may vary due to age or medical condition, Ondansetron and TIVA  Airway Management Planned: LMA  Additional Equipment: None  Intra-op Plan:   Post-operative Plan: Extubation in OR  Informed Consent: I have reviewed the patients History and Physical, chart, labs and discussed the procedure including the risks, benefits and alternatives for the proposed anesthesia with the patient or authorized representative who has indicated his/her understanding and acceptance.     Dental advisory given  Plan Discussed with: CRNA and Anesthesiologist  Anesthesia Plan Comments:         Anesthesia Quick  Evaluation

## 2023-03-26 ENCOUNTER — Ambulatory Visit (HOSPITAL_BASED_OUTPATIENT_CLINIC_OR_DEPARTMENT_OTHER): Payer: Medicare HMO | Admitting: Certified Registered"

## 2023-03-26 ENCOUNTER — Ambulatory Visit (HOSPITAL_BASED_OUTPATIENT_CLINIC_OR_DEPARTMENT_OTHER)
Admission: RE | Admit: 2023-03-26 | Discharge: 2023-03-26 | Disposition: A | Discharge status: Home / Self Care | Payer: Medicare HMO | Attending: General Surgery | Admitting: General Surgery

## 2023-03-26 ENCOUNTER — Other Ambulatory Visit: Payer: Self-pay

## 2023-03-26 ENCOUNTER — Encounter (HOSPITAL_BASED_OUTPATIENT_CLINIC_OR_DEPARTMENT_OTHER): Payer: Self-pay | Admitting: General Surgery

## 2023-03-26 ENCOUNTER — Ambulatory Visit
Admission: RE | Admit: 2023-03-26 | Discharge: 2023-03-26 | Disposition: A | Discharge status: Home / Self Care | Payer: Medicare HMO | Source: Ambulatory Visit | Attending: General Surgery | Admitting: General Surgery

## 2023-03-26 ENCOUNTER — Encounter (HOSPITAL_BASED_OUTPATIENT_CLINIC_OR_DEPARTMENT_OTHER)
Admission: RE | Disposition: A | Discharge status: Home / Self Care | Payer: Self-pay | Source: Home / Self Care | Attending: General Surgery

## 2023-03-26 DIAGNOSIS — E669 Obesity, unspecified: Secondary | ICD-10-CM | POA: Diagnosis not present

## 2023-03-26 DIAGNOSIS — C50411 Malignant neoplasm of upper-outer quadrant of right female breast: Secondary | ICD-10-CM | POA: Diagnosis not present

## 2023-03-26 DIAGNOSIS — C50412 Malignant neoplasm of upper-outer quadrant of left female breast: Secondary | ICD-10-CM | POA: Diagnosis not present

## 2023-03-26 DIAGNOSIS — C50012 Malignant neoplasm of nipple and areola, left female breast: Secondary | ICD-10-CM

## 2023-03-26 DIAGNOSIS — C50912 Malignant neoplasm of unspecified site of left female breast: Secondary | ICD-10-CM

## 2023-03-26 DIAGNOSIS — Z170 Estrogen receptor positive status [ER+]: Secondary | ICD-10-CM | POA: Diagnosis not present

## 2023-03-26 DIAGNOSIS — Z87891 Personal history of nicotine dependence: Secondary | ICD-10-CM

## 2023-03-26 DIAGNOSIS — I10 Essential (primary) hypertension: Secondary | ICD-10-CM | POA: Diagnosis not present

## 2023-03-26 DIAGNOSIS — Z6834 Body mass index (BMI) 34.0-34.9, adult: Secondary | ICD-10-CM | POA: Insufficient documentation

## 2023-03-26 DIAGNOSIS — Z01818 Encounter for other preprocedural examination: Secondary | ICD-10-CM

## 2023-03-26 DIAGNOSIS — K219 Gastro-esophageal reflux disease without esophagitis: Secondary | ICD-10-CM | POA: Insufficient documentation

## 2023-03-26 DIAGNOSIS — Z79899 Other long term (current) drug therapy: Secondary | ICD-10-CM | POA: Diagnosis not present

## 2023-03-26 DIAGNOSIS — E039 Hypothyroidism, unspecified: Secondary | ICD-10-CM | POA: Diagnosis not present

## 2023-03-26 DIAGNOSIS — C50911 Malignant neoplasm of unspecified site of right female breast: Secondary | ICD-10-CM

## 2023-03-26 HISTORY — PX: BREAST LUMPECTOMY WITH RADIOACTIVE SEED LOCALIZATION: SHX6424

## 2023-03-26 HISTORY — DX: Gastro-esophageal reflux disease without esophagitis: K21.9

## 2023-03-26 SURGERY — BREAST LUMPECTOMY WITH RADIOACTIVE SEED LOCALIZATION
Anesthesia: General | Site: Breast | Laterality: Bilateral

## 2023-03-26 MED ORDER — ONDANSETRON HCL 4 MG/2ML IJ SOLN
INTRAMUSCULAR | Status: AC
  Filled 2023-03-26: qty 2

## 2023-03-26 MED ORDER — DEXAMETHASONE SODIUM PHOSPHATE 10 MG/ML IJ SOLN
INTRAMUSCULAR | Status: AC
  Filled 2023-03-26: qty 1

## 2023-03-26 MED ORDER — LIDOCAINE 2% (20 MG/ML) 5 ML SYRINGE
INTRAMUSCULAR | Status: AC
  Filled 2023-03-26: qty 5

## 2023-03-26 MED ORDER — FENTANYL CITRATE (PF) 100 MCG/2ML IJ SOLN
INTRAMUSCULAR | Status: DC | PRN
  Administered 2023-03-26 (×3): 25 ug via INTRAVENOUS

## 2023-03-26 MED ORDER — LIDOCAINE HCL (CARDIAC) PF 100 MG/5ML IV SOSY
PREFILLED_SYRINGE | INTRAVENOUS | Status: DC | PRN
  Administered 2023-03-26: 40 mg via INTRAVENOUS

## 2023-03-26 MED ORDER — PROPOFOL 500 MG/50ML IV EMUL
INTRAVENOUS | Status: DC | PRN
  Administered 2023-03-26: 125 ug/kg/min via INTRAVENOUS

## 2023-03-26 MED ORDER — DEXAMETHASONE SODIUM PHOSPHATE 10 MG/ML IJ SOLN
INTRAMUSCULAR | Status: DC | PRN
  Administered 2023-03-26: 5 mg via INTRAVENOUS

## 2023-03-26 MED ORDER — EPHEDRINE SULFATE (PRESSORS) 50 MG/ML IJ SOLN
INTRAMUSCULAR | Status: DC | PRN
  Administered 2023-03-26: 5 mg via INTRAVENOUS

## 2023-03-26 MED ORDER — CEFAZOLIN SODIUM-DEXTROSE 2-4 GM/100ML-% IV SOLN
INTRAVENOUS | Status: AC
  Filled 2023-03-26: qty 100

## 2023-03-26 MED ORDER — FENTANYL CITRATE (PF) 100 MCG/2ML IJ SOLN
INTRAMUSCULAR | Status: AC
  Filled 2023-03-26: qty 2

## 2023-03-26 MED ORDER — ACETAMINOPHEN 500 MG PO TABS
1000.0000 mg | ORAL_TABLET | Freq: Once | ORAL | Status: AC
  Administered 2023-03-26: 1000 mg via ORAL

## 2023-03-26 MED ORDER — LACTATED RINGERS IV SOLN: INTRAVENOUS | Status: DC

## 2023-03-26 MED ORDER — BUPIVACAINE HCL (PF) 0.25 % IJ SOLN
INTRAMUSCULAR | Status: DC | PRN
  Administered 2023-03-26: 20 mL

## 2023-03-26 MED ORDER — PROPOFOL 10 MG/ML IV BOLUS
INTRAVENOUS | Status: AC
  Filled 2023-03-26: qty 20

## 2023-03-26 MED ORDER — OXYCODONE HCL 5 MG PO TABS: 5.0000 mg | ORAL_TABLET | Freq: Once | ORAL | Status: DC | PRN

## 2023-03-26 MED ORDER — FENTANYL CITRATE (PF) 100 MCG/2ML IJ SOLN: 25.0000 ug | INTRAMUSCULAR | Status: DC | PRN

## 2023-03-26 MED ORDER — ONDANSETRON HCL 4 MG/2ML IJ SOLN: 4.0000 mg | Freq: Once | INTRAMUSCULAR | Status: DC | PRN

## 2023-03-26 MED ORDER — EPHEDRINE 5 MG/ML INJ
INTRAVENOUS | Status: AC
  Filled 2023-03-26: qty 5

## 2023-03-26 MED ORDER — PROPOFOL 10 MG/ML IV BOLUS
INTRAVENOUS | Status: DC | PRN
  Administered 2023-03-26: 110 mg via INTRAVENOUS

## 2023-03-26 MED ORDER — CEFAZOLIN SODIUM-DEXTROSE 2-4 GM/100ML-% IV SOLN
2.0000 g | INTRAVENOUS | Status: AC
  Administered 2023-03-26: 2 g via INTRAVENOUS

## 2023-03-26 MED ORDER — ACETAMINOPHEN 500 MG PO TABS
ORAL_TABLET | ORAL | Status: AC
  Filled 2023-03-26: qty 2

## 2023-03-26 MED ORDER — ONDANSETRON HCL 4 MG/2ML IJ SOLN
INTRAMUSCULAR | Status: DC | PRN
  Administered 2023-03-26: 4 mg via INTRAVENOUS

## 2023-03-26 MED ORDER — OXYCODONE HCL 5 MG/5ML PO SOLN: 5.0000 mg | Freq: Once | ORAL | Status: DC | PRN

## 2023-03-26 MED ORDER — ACETAMINOPHEN 500 MG PO TABS: 1000.0000 mg | ORAL_TABLET | ORAL | Status: AC

## 2023-03-26 SURGICAL SUPPLY — 51 items
ADH SKN CLS APL DERMABOND .7 (GAUZE/BANDAGES/DRESSINGS) ×1
APL PRP STRL LF DISP 70% ISPRP (MISCELLANEOUS) ×2
APPLIER CLIP 9.375 MED OPEN (MISCELLANEOUS) ×1
APR CLP MED 9.3 20 MLT OPN (MISCELLANEOUS) ×1
BINDER BREAST XLRG (GAUZE/BANDAGES/DRESSINGS) IMPLANT
BINDER BREAST XXLRG (GAUZE/BANDAGES/DRESSINGS) IMPLANT
BLADE SURG 15 STRL LF DISP TIS (BLADE) ×1 IMPLANT
BLADE SURG 15 STRL SS (BLADE) ×1
CANISTER SUC SOCK COL 7IN (MISCELLANEOUS) IMPLANT
CANISTER SUCT 1200ML W/VALVE (MISCELLANEOUS) IMPLANT
CHLORAPREP W/TINT 26 (MISCELLANEOUS) ×1 IMPLANT
CLIP APPLIE 9.375 MED OPEN (MISCELLANEOUS) IMPLANT
COVER BACK TABLE 60X90IN (DRAPES) ×1 IMPLANT
COVER MAYO STAND STRL (DRAPES) ×1 IMPLANT
COVER PROBE CYLINDRICAL 5X96 (MISCELLANEOUS) ×1 IMPLANT
DERMABOND ADVANCED .7 DNX12 (GAUZE/BANDAGES/DRESSINGS) ×1 IMPLANT
DRAPE LAPAROSCOPIC ABDOMINAL (DRAPES) ×1 IMPLANT
DRAPE UTILITY XL STRL (DRAPES) ×1 IMPLANT
DRSG TEGADERM 4X4.75 (GAUZE/BANDAGES/DRESSINGS) IMPLANT
ELECT COATED BLADE 2.86 ST (ELECTRODE) ×1 IMPLANT
ELECT REM PT RETURN 9FT ADLT (ELECTROSURGICAL) ×1
ELECTRODE REM PT RTRN 9FT ADLT (ELECTROSURGICAL) ×1 IMPLANT
GAUZE SPONGE 4X4 12PLY STRL LF (GAUZE/BANDAGES/DRESSINGS) IMPLANT
GLOVE BIO SURGEON STRL SZ 6.5 (GLOVE) IMPLANT
GLOVE BIO SURGEON STRL SZ7 (GLOVE) ×2 IMPLANT
GLOVE BIOGEL PI IND STRL 7.5 (GLOVE) ×1 IMPLANT
GOWN STRL REUS W/ TWL LRG LVL3 (GOWN DISPOSABLE) ×2 IMPLANT
GOWN STRL REUS W/TWL LRG LVL3 (GOWN DISPOSABLE) ×3
HEMOSTAT ARISTA ABSORB 3G PWDR (HEMOSTASIS) IMPLANT
KIT MARKER MARGIN INK (KITS) ×1 IMPLANT
NDL HYPO 25X1 1.5 SAFETY (NEEDLE) ×1 IMPLANT
NEEDLE HYPO 25X1 1.5 SAFETY (NEEDLE) ×1 IMPLANT
NS IRRIG 1000ML POUR BTL (IV SOLUTION) IMPLANT
PACK BASIN DAY SURGERY FS (CUSTOM PROCEDURE TRAY) ×1 IMPLANT
PENCIL SMOKE EVACUATOR (MISCELLANEOUS) ×1 IMPLANT
SLEEVE SCD COMPRESS KNEE MED (STOCKING) ×1 IMPLANT
SPONGE T-LAP 4X18 ~~LOC~~+RFID (SPONGE) ×1 IMPLANT
STRIP CLOSURE SKIN 1/2X4 (GAUZE/BANDAGES/DRESSINGS) ×1 IMPLANT
SUT MNCRL AB 4-0 PS2 18 (SUTURE) ×1 IMPLANT
SUT MON AB 5-0 PS2 18 (SUTURE) IMPLANT
SUT SILK 2 0 SH (SUTURE) IMPLANT
SUT VIC AB 2-0 SH 27 (SUTURE) ×5
SUT VIC AB 2-0 SH 27XBRD (SUTURE) ×1 IMPLANT
SUT VIC AB 3-0 SH 27 (SUTURE) ×2
SUT VIC AB 3-0 SH 27X BRD (SUTURE) ×1 IMPLANT
SUT VIC AB 5-0 PS2 18 (SUTURE) IMPLANT
SYR CONTROL 10ML LL (SYRINGE) ×1 IMPLANT
TOWEL GREEN STERILE FF (TOWEL DISPOSABLE) ×1 IMPLANT
TRAY FAXITRON CT DISP (TRAY / TRAY PROCEDURE) ×1 IMPLANT
TUBE CONNECTING 20X1/4 (TUBING) IMPLANT
YANKAUER SUCT BULB TIP NO VENT (SUCTIONS) IMPLANT

## 2023-03-26 NOTE — Anesthesia Postprocedure Evaluation (Signed)
Anesthesia Post Note  Patient: Carolyn Parsons  Procedure(s) Performed: RIGHT BREAST SEED BRACKETED LUMPECTOMY, LEFT BREAST SEED BRACKETED LUMPECTOMY (Bilateral: Breast)     Patient location during evaluation: PACU Anesthesia Type: General Level of consciousness: awake and alert Pain management: pain level controlled Vital Signs Assessment: post-procedure vital signs reviewed and stable Respiratory status: spontaneous breathing, nonlabored ventilation and respiratory function stable Cardiovascular status: stable and blood pressure returned to baseline Anesthetic complications: no   No notable events documented.  Last Vitals:  Vitals:   03/26/23 0930 03/26/23 0943  BP: (!) 150/54 (!) 154/89  Pulse: (!) 55 60  Resp: 16 16  Temp:  (!) 36.2 C  SpO2: 93% 93%    Last Pain:  Vitals:   03/26/23 0943  TempSrc:   PainSc: 0-No pain                 Beryle Lathe

## 2023-03-26 NOTE — Anesthesia Procedure Notes (Signed)
Procedure Name: LMA Insertion Date/Time: 03/26/2023 7:42 AM  Performed by: Lauralyn Primes, CRNAPre-anesthesia Checklist: Patient identified, Emergency Drugs available, Suction available and Patient being monitored Patient Re-evaluated:Patient Re-evaluated prior to induction Oxygen Delivery Method: Circle system utilized Preoxygenation: Pre-oxygenation with 100% oxygen Induction Type: IV induction Ventilation: Mask ventilation without difficulty LMA: LMA inserted LMA Size: 4.0 Number of attempts: 1 Airway Equipment and Method: Bite block Placement Confirmation: positive ETCO2 Tube secured with: Tape Dental Injury: Teeth and Oropharynx as per pre-operative assessment

## 2023-03-26 NOTE — Interval H&P Note (Signed)
History and Physical Interval Note:  03/26/2023 7:04 AM  Carolyn Parsons  has presented today for surgery, with the diagnosis of BILATERAL BREAST CANCER.  The various methods of treatment have been discussed with the patient and family. After consideration of risks, benefits and other options for treatment, the patient has consented to  Procedure(s): RIGHT BREAST SEED BRACKETED LUMPECTOMY, LEFT BREAST SEED BRACKETED LUMPECTOMY (Bilateral) as a surgical intervention.  The patient's history has been reviewed, patient examined, no change in status, stable for surgery.  I have reviewed the patient's chart and labs.  Questions were answered to the patient's satisfaction.     [name]

## 2023-03-26 NOTE — Op Note (Signed)
Preoperative diagnosis: Bilateral breast cancers Postoperative diagnosis: Same as above Procedure: 1.  Right breast radioactive seed bracketed lumpectomy 2.  Left breast radioactive seed bracketed lumpectomy Surgeon: Dr. Harden Mo Anesthesia: General Estimated blood loss: Minimal Specimens: 1.  Right breast tissue containing both seeds marked with paint 2.  Additional right anterior and medial margins marked short superior, long lateral, double deep 3 left breast tissue containing both seeds marked with paint 4.  Additional left posterior and medial margins marked short superior, long lateral, double deep Complications: None Drains: None Sponge needle counts correct completion Disposition recovery stable condition  Indications:89-year-old female who is otherwise fairly healthy. She lives with her daughter and her son-in-law. She has not had any prior breast history. She palpated a right breast mass. She was then evaluated by mammography. She has C density breast tissue. On mammogram she had a upper outer retroareolar right breast spiculated mass. There is another 1 in the upper outer quadrant of the left breast. Ultrasound of the right breast showed at 11:00 1 cm from the nipple there is a 1.4 x 1.4 x 1.4 cm mass. There is also a 4 x 2 x 4 mm mass about 7 mm away from that. The dominant mass has been biopsied this is a grade 2 invasive ductal carcinoma that is 95% ER positive, 20% PR positive, and HER2 is negative. The right side there is no abnormal lymph nodes. On the ultrasound of the left breast there is at 12:00 1 cm from the nipple a 1.6 x 1.2 x 1.3 cm mass. There also is at 1:00 2 cm of the nipple a 7 x 5 x 6 mm mass there are 2 others at 11:00 measuring 5 mm as well as 7 mm. Ultrasound of the left axilla is negative. Biopsy of the left breast shows that one of the 11:00 lesions is negative. There is still one that has not been biopsied. The 12:00 and 1:00 lesions have been both  biopsied are invasive mammary carcinoma with papillary features. There is not an ER done on this. It is PR positive at 100% and the KI is 5%.  After all of the biopsies we elected to proceed with bilateral bracketed lumpectomies.  We discussed omitting sentinel nodes given her age as well as the tumor characteristics.  Procedure: After informed consent was obtained she was taken to the operating room.  She was given antibiotics.  SCDs were in place.  She was placed under general esthesia without complication.  She was prepped and draped in the standard sterile surgical fashion.  Surgical timeout was then performed.  I did the right lumpectomy first.  I made a periareolar incision in order to hide the scar later.  I then used the neoprobe to identify both seeds.  I removed both seeds as well as the surrounding tissue and attempt to get a clear margin.  Mammogram confirmed removal of the seeds.  I did a 3D image and thought I might be close to 2 margins.  I remove the medial margin and marked it as above.  I also removed a portion of the skin in a crescent shape from the areola as well as any additional anterior tissue.  Anterior margin is now just the skin.  Hemostasis was obtained.  I then closed the breast tissue with 2-0 Vicryl.  The skin was closed with 3-0 Vicryl and 5-0 Monocryl.  Glue Steri-Strips were eventually applied.  The left lumpectomy was performed in a similar fashion.  I made a periareolar incision.  I dissected to the seeds.  I then used the neoprobe to identify both seeds and remove these as well as the surrounding tissue and attempt to get a clear margin.  I removed more tissue due to the fact that the area was larger than the span of the seeds.  Mammogram confirmed removal of both seeds.  I did a 3D image and thought I might be close to 2 margin so I remove these as well.  Hemostasis was then obtained.  I placed some clips in the cavity.  I closed this with 2-0 Vicryl.  The skin was then  closed with 3-0 Vicryl 5-0 Monocryl.  Glue and Steri-Strips were applied.  She tolerated this well was extubated transferred recovery stable.

## 2023-03-26 NOTE — Transfer of Care (Signed)
Immediate Anesthesia Transfer of Care Note  Patient: Carolyn Parsons  Procedure(s) Performed: RIGHT BREAST SEED BRACKETED LUMPECTOMY, LEFT BREAST SEED BRACKETED LUMPECTOMY (Bilateral: Breast)  Patient Location: PACU  Anesthesia Type:General  Level of Consciousness: drowsy  Airway & Oxygen Therapy: Patient Spontanous Breathing and Patient connected to face mask oxygen  Post-op Assessment: Report given to RN and Post -op Vital signs reviewed and stable  Post vital signs: Reviewed and stable  Last Vitals:  Vitals Value Taken Time  BP 147/58 (83)   Temp    Pulse 56 03/26/23 0914  Resp 20 03/26/23 0914  SpO2 98 % 03/26/23 0914  Vitals shown include unvalidated device data.  Last Pain:  Vitals:   03/26/23 0622  TempSrc: Oral  PainSc: 0-No pain      Patients Stated Pain Goal: 3 (03/26/23 0622)  Complications: No notable events documented.

## 2023-03-26 NOTE — H&P (Signed)
89 year old female who is otherwise fairly healthy. She lives with her daughter and her son-in-law. She has not had any prior breast history. She palpated a right breast mass. She was then evaluated by mammography. She has C density breast tissue. On mammogram she had a upper outer retroareolar right breast spiculated mass. There is another 1 in the upper outer quadrant of the left breast. Ultrasound of the right breast showed at 11:00 1 cm from the nipple there is a 1.4 x 1.4 x 1.4 cm mass. There is also a 4 x 2 x 4 mm mass about 7 mm away from that. The dominant mass has been biopsied this is a grade 2 invasive ductal carcinoma that is 95% ER positive, 20% PR positive, and HER2 is negative. The right side there is no abnormal lymph nodes. On the ultrasound of the left breast there is at 12:00 1 cm from the nipple a 1.6 x 1.2 x 1.3 cm mass. There also is at 1:00 2 cm of the nipple a 7 x 5 x 6 mm mass there are 2 others at 11:00 measuring 5 mm as well as 7 mm. Ultrasound of the left axilla is negative. Biopsy of the left breast shows that one of the 11:00 lesions is negative. There is still one that has not been biopsied. The 12:00 and 1:00 lesions have been both biopsied are invasive mammary carcinoma with papillary features. There is not an ER done on this. It is PR positive at 100% and the KI is 5%. There is no other information available right now. She is here to discuss her options.  Review of Systems: A complete review of systems was obtained from the patient. I have reviewed this information and discussed as appropriate with the patient. See HPI as well for other ROS.  Review of Systems  HENT: Positive for hearing loss.  All other systems reviewed and are negative.  Medical History: Past Medical History:  Diagnosis Date  Arthritis  Glaucoma (increased eye pressure)  Hypertension   Past Surgical History:  Procedure Laterality Date  APPENDECTOMY  CATARACT EXTRACTION  CHOLECYSTECTOMY    Allergies  Allergen Reactions  Amoxicillin Itching and Rash  Penicillins Itching  Red rash with itching Rash on stomach   Current Outpatient Medications on File Prior to Visit  Medication Sig Dispense Refill  amLODIPine (NORVASC) 5 MG tablet Take 5 mg by mouth once daily  COMBIGAN 0.2-0.5 % ophthalmic solution INSTILL 1 DROP INTO BOTH EYES EVERY 12 HOURS  fexofenadine (ALLEGRA) 180 MG tablet Take 180 mg by mouth once daily  fluticasone propionate (FLONASE) 50 mcg/actuation nasal spray Place 1 spray into one nostril once daily  levothyroxine (SYNTHROID) 50 MCG tablet Take 1 tablet by mouth once daily  metoprolol succinate (TOPROL-XL) 25 MG XL tablet Take 1 tablet by mouth once daily  pantoprazole (PROTONIX) 40 MG DR tablet Take 40 mg by mouth once daily  rosuvastatin (CRESTOR) 20 MG tablet Take 20 mg by mouth once daily   History reviewed. No pertinent family history.   Social History   Tobacco Use  Smoking Status Former  Current packs/day: 0.00  Types: Cigarettes  Quit date: 1995  Years since quitting: 29.3  Smokeless Tobacco Not on file  Marital status: Widowed  Tobacco Use  Smoking status: Former  Current packs/day: 0.00  Types: Cigarettes  Quit date: 1995  Years since quitting: 29.3  Substance and Sexual Activity  Alcohol use: Not Currently  Drug use: Not Currently   Objective:  Body mass index is 35.14 kg/m.  Physical Exam Vitals reviewed.  Constitutional:  Appearance: Normal appearance.  Chest:  Breasts: Right: Mass present. No inverted nipple or nipple discharge.  Left: No inverted nipple, mass or nipple discharge.  Lymphadenopathy:  Upper Body:  Right upper body: No supraclavicular or axillary adenopathy.  Left upper body: No supraclavicular or axillary adenopathy.  Neurological:  Mental Status: She is alert.   Assessment and Plan:   Malignant neoplasm of central portion of both breasts in female, estrogen receptor positive  (CMS/HHS-HCC)  Bilateral seed bracketed lumpectomies   We discussed the staging and pathophysiology of breast cancer. We discussed all of the different options for treatment for breast cancer including surgery, chemotherapy, radiation therapy, Herceptin, and antiestrogen therapy.  She asked about no surgery. This would be reasonable I think if she were not so healthy. Has multiple family members who lived over 48.   I do not think she needs lymph node biopsies.  We discussed the options for treatment of the breast cancer which included lumpectomy versus a mastectomy. We discussed the performance of the lumpectomy with radioactive seed placement. We discussed a 5-10% chance of a positive margin requiring reexcision in the operating room. We also discussed that she will likely need radiation therapy if she undergoes lumpectomy. We discussed mastectomy and the postoperative care for that as well. Mastectomy can be followed by reconstruction. The decision for lumpectomy vs mastectomy has no impact on decision for chemotherapy. Most mastectomy patients will not need radiation therapy. We discussed that there is no difference in her survival whether she undergoes lumpectomy with radiation therapy or antiestrogen therapy versus a mastectomy. There is also no real difference between her recurrence in the breast.  I think we can do bracketed lumpectomies on both sides.  We discussed the risks of operation including bleeding, infection, possible reoperation. She understands her further therapy will be based on what her stages at the time of her operation.

## 2023-03-26 NOTE — Discharge Instructions (Addendum)
[facility] Office Phone Number [phone]  POST OP INSTRUCTIONS Take 400 mg of ibuprofen every 8 hours or 650 mg tylenol every 6 hours for next 72 hours then as needed. Use ice several times daily also.  A prescription for pain medication may be given to you upon discharge.  Take your pain medication as prescribed, if needed.  If narcotic pain medicine is not needed, then you may take acetaminophen (Tylenol), naprosyn (Alleve) or ibuprofen (Advil) as needed. Take your usually prescribed medications unless otherwise directed If you need a refill on your pain medication, please contact your pharmacy.  They will contact our office to request authorization.  Prescriptions will not be filled after 5pm or on week-ends. You should eat very light the first 24 hours after surgery, such as soup, crackers, pudding, etc.  Resume your normal diet the day after surgery. Most patients will experience some swelling and bruising in the breast.  Ice packs and a good support bra will help.  Wear the breast binder provided or a sports bra for 72 hours day and night.  After that wear a sports bra during the day until you return to the office. Swelling and bruising can take several days to resolve.  It is common to experience some constipation if taking pain medication after surgery.  Increasing fluid intake and taking a stool softener will usually help or prevent this problem from occurring.  A mild laxative (Milk of Magnesia or Miralax) should be taken according to package directions if there are no bowel movements after 48 hours. I used skin glue on the incision, you may shower in 24 hours.  The glue will flake off over the next 2-3 weeks.  Any sutures or staples will be removed at the office during your follow-up visit. ACTIVITIES:  You may resume regular daily activities (gradually increasing) beginning the next day.  Wearing a good support bra or sports bra minimizes pain and swelling.  You may have  sexual intercourse when it is comfortable. You may drive when you no longer are taking prescription pain medication, you can comfortably wear a seatbelt, and you can safely maneuver your car and apply brakes. RETURN TO WORK:  ______________________________________________________________________________________ Bonita Quin should see your doctor in the office for a follow-up appointment approximately two weeks after your surgery.  Your doctor's nurse will typically make your follow-up appointment when she calls you with your pathology report.  Expect your pathology report 3-4 business days after your surgery.  You may call to check if you do not hear from [place] after three days. OTHER INSTRUCTIONS: _______________________________________________________________________________________________ _____________________________________________________________________________________________________________________________________ _____________________________________________________________________________________________________________________________________ _____________________________________________________________________________________________________________________________________  WHEN TO CALL DR WAKEFIELD: Fever over 101.0 Nausea and/or vomiting. Extreme swelling or bruising. Continued bleeding from incision. Increased pain, redness, or drainage from the incision.  The clinic staff is available to answer your questions during regular business hours.  Please don't hesitate to call and ask to speak to one of the nurses for clinical concerns.  If you have a medical emergency, go to the nearest emergency room or call 911.  A surgeon from [facility] is always on call at the hospital.  For further questions, please visit [email or web address]     Post Anesthesia Home Care Instructions  Activity: Get plenty of rest for the remainder of the day. A responsible individual must stay  with you for 24 hours following the procedure.  For the next 24 hours, DO NOT: -Drive a car -Advertising copywriter -Drink alcoholic beverages -Take any medication unless  instructed by your physician -Make any legal decisions or sign important papers.  Meals: Start with liquid foods such as gelatin or soup. Progress to regular foods as tolerated. Avoid greasy, spicy, heavy foods. If nausea and/or vomiting occur, drink only clear liquids until the nausea and/or vomiting subsides. Call your physician if vomiting continues.  Special Instructions/Symptoms: Your throat may feel dry or sore from the anesthesia or the breathing tube placed in your throat during surgery. If this causes discomfort, gargle with warm salt water. The discomfort should disappear within 24 hours.  If you had a scopolamine patch placed behind your ear for the management of post- operative nausea and/or vomiting:  1. The medication in the patch is effective for 72 hours, after which it should be removed.  Wrap patch in a tissue and discard in the trash. Wash hands thoroughly with soap and water. 2. You may remove the patch earlier than 72 hours if you experience unpleasant side effects which may include dry mouth, dizziness or visual disturbances. 3. Avoid touching the patch. Wash your hands with soap and water after contact with the patch.    Next dose of Tylenol may be given at 12:30pm if needed.

## 2023-03-27 ENCOUNTER — Encounter (HOSPITAL_BASED_OUTPATIENT_CLINIC_OR_DEPARTMENT_OTHER): Payer: Self-pay | Admitting: General Surgery

## 2023-03-27 LAB — SURGICAL PATHOLOGY

## 2023-03-29 ENCOUNTER — Ambulatory Visit: Payer: Medicare HMO | Admitting: Family Medicine

## 2023-03-29 ENCOUNTER — Other Ambulatory Visit: Payer: Self-pay

## 2023-03-29 ENCOUNTER — Encounter: Payer: Self-pay | Admitting: Family Medicine

## 2023-03-29 VITALS — BP 196/67 | HR 53 | Temp 97.2°F | Resp 20 | Ht 59.0 in | Wt 173.0 lb

## 2023-03-29 DIAGNOSIS — S00411A Abrasion of right ear, initial encounter: Secondary | ICD-10-CM

## 2023-03-29 NOTE — Progress Notes (Signed)
Subjective:  Patient ID: Carolyn Parsons, female    DOB: 03-07-1932, 89 y.o.   MRN: [id number]  Patient Care Team: [name], FNP as PCP - General (Family Medicine) Kathryne Hitch, MD as Consulting Physician (Orthopedic Surgery) Donnelly Angelica, RN as Oncology Nurse Navigator Pershing Proud, RN as Oncology Nurse Navigator Serena Croissant, MD as Consulting Physician (Hematology and Oncology) [name], MD as Consulting Physician (General Surgery) Lonie Peak, MD as Attending Physician (Radiation Oncology)   Chief Complaint:  Inside of right ear sore   HPI: Carolyn Parsons is a 89 y.o. female presenting on 03/29/2023 for Inside of right ear sore   Pt presents today for evaluation of right ear. States she thought she lost her fingernail in her ear canal. Has been trying to clean her ears and now has an abrasion in her canal. Unsure if nail is in ear or not.     Relevant past medical, surgical, family, and social history reviewed and updated as indicated.  Allergies and medications reviewed and updated. Data reviewed: Chart in Epic.   Past Medical History:  Diagnosis Date   Cataract    GERD (gastroesophageal reflux disease)    Hyperlipidemia    Hypertension     Past Surgical History:  Procedure Laterality Date   APPENDECTOMY     BLADDER REPAIR     BREAST BIOPSY Left 01/25/2023   [place] LT BREAST BX W LOC DEV EA ADD LESION IMG BX SPEC US GUIDE 01/25/2023 [facility]   BREAST BIOPSY Left 01/25/2023   [place] LT BREAST BX W LOC DEV 1ST LESION IMG BX SPEC US GUIDE 01/25/2023 [facility]   BREAST BIOPSY Left 01/25/2023   [place] LT BREAST BX W LOC DEV EA ADD LESION IMG BX SPEC US GUIDE 01/25/2023 [facility]   BREAST BIOPSY Right 01/25/2023   [place] RT BREAST BX W LOC DEV 1ST LESION IMG BX SPEC US GUIDE 01/25/2023 [facility]   BREAST BIOPSY Left 02/21/2023   [place] LT BREAST BX W LOC DEV 1ST LESION IMG BX SPEC US GUIDE 02/21/2023 [facility]    BREAST BIOPSY Left 03/22/2023   [place] LT RADIOACTIVE SEED EA ADD LESION 03/22/2023 [facility]   BREAST BIOPSY  03/22/2023   [place] RT RADIOACTIVE SEED LOC 03/22/2023 [facility]   BREAST BIOPSY Right 03/22/2023   [place] RT RADIOACTIVE SEED EA ADD LESION 03/22/2023 [facility]   BREAST BIOPSY Left 03/22/2023   [place] LT RADIOACTIVE SEED LOC 03/22/2023 [facility]   BREAST LUMPECTOMY WITH RADIOACTIVE SEED LOCALIZATION Bilateral 03/26/2023   Procedure: RIGHT BREAST SEED BRACKETED LUMPECTOMY, LEFT BREAST SEED BRACKETED LUMPECTOMY;  Surgeon: [name], MD;  Location: [facility];  Service: General;  Laterality: Bilateral;   CATARACT EXTRACTION, BILATERAL     CHOLECYSTECTOMY      Social History   Socioeconomic History   Marital status: Widowed    Spouse name: Not on file   Number of children: 4   Years of education: Not on file   Highest education level: 8th grade  Occupational History   Occupation: retired    Comment: school bus driver   Tobacco Use   Smoking status: Former    Packs/day: 0.25    Years: 20.00    Additional pack years: 0.00    Total pack years: 5.00    Types: Cigarettes    Quit date: 10/29/1993    Years since quitting: 29.4   Smokeless tobacco: Never  Vaping Use  Vaping Use: Never used  Substance and Sexual Activity   Alcohol use: Never   Drug use: Never   Sexual activity: Not on file  Other Topics Concern   Not on file  Social History Narrative   Not on file   Social Determinants of Health   Financial Resource Strain: Low Risk  (12/01/2018)   Overall Financial Resource Strain (CARDIA)    Difficulty of Paying Living Expenses: Not hard at all  Food Insecurity: No Food Insecurity (12/01/2018)   Hunger Vital Sign    Worried About Running Out of Food in the Last Year: Never true    Ran Out of Food in the Last Year: Never true  Transportation Needs: No Transportation Needs (12/01/2018)   PRAPARE - Scientist, research (physical sciences) (Medical): No    Lack of Transportation (Non-Medical): No  Physical Activity: Inactive (12/01/2018)   Exercise Vital Sign    Days of Exercise per Week: 0 days    Minutes of Exercise per Session: 0 min  Stress: Not on file  Social Connections: Moderately Isolated (12/01/2018)   Social Connection and Isolation Panel [NHANES]    Frequency of Communication with Friends and Family: Once a week    Frequency of Social Gatherings with Friends and Family: More than three times a week    Attends Religious Services: Never    Database administrator or Organizations: No    Attends Banker Meetings: Never    Marital Status: Widowed  Intimate Partner Violence: Not At Risk (12/01/2018)   Humiliation, Afraid, Rape, and Kick questionnaire    Fear of Current or Ex-Partner: No    Emotionally Abused: No    Physically Abused: No    Sexually Abused: No    Outpatient Encounter Medications as of 03/29/2023  Medication Sig   amLODipine (NORVASC) 5 MG tablet Take 1 tablet (5 mg total) by mouth daily.   brimonidine-timolol (COMBIGAN) 0.2-0.5 % ophthalmic solution INSTILL 1 DROP INTO BOTH  EYES EVERY 12 HOURS   fexofenadine (ALLEGRA) 180 MG tablet Take 1 tablet (180 mg total) by mouth daily.   fluticasone (FLONASE) 50 MCG/ACT nasal spray Place 1 spray into both nostrils daily.   levothyroxine (SYNTHROID) 50 MCG tablet Take 1 tablet (50 mcg total) by mouth daily.   metoprolol succinate (TOPROL-XL) 25 MG 24 hr tablet Take 1 tablet (25 mg total) by mouth daily.   pantoprazole (PROTONIX) 40 MG tablet Take 1 tablet (40 mg total) by mouth daily.   rosuvastatin (CRESTOR) 20 MG tablet Take 1 tablet (20 mg total) by mouth daily.   No facility-administered encounter medications on file as of 03/29/2023.    Allergies  Allergen Reactions   Amoxicillin     Red rash with itching    Penicillins     Rash on stomach    Review of Systems  Constitutional:  Negative for activity change, appetite  change, chills, diaphoresis, fatigue, fever and unexpected weight change.  HENT:  Positive for ear pain.   Eyes: Negative.   Respiratory:  Negative for cough, chest tightness and shortness of breath.   Cardiovascular:  Negative for chest pain, palpitations and leg swelling.  Gastrointestinal:  Negative for blood in stool, constipation, diarrhea, nausea and vomiting.  Endocrine: Negative.   Genitourinary:  Negative for dysuria, frequency and urgency.  Musculoskeletal:  Negative for arthralgias and myalgias.  Skin: Negative.   Allergic/Immunologic: Negative.   Neurological:  Negative for dizziness and headaches.  Hematological: Negative.  Psychiatric/Behavioral:  Negative for confusion, hallucinations, sleep disturbance and suicidal ideas.   All other systems reviewed and are negative.       Objective:  BP (!) 196/67   Pulse (!) 53   Temp (!) 97.2 F (36.2 C) (Temporal)   Resp 20   Ht 4\' 11"  (1.499 m)   Wt 173 lb (78.5 kg)   SpO2 96%   BMI 34.94 kg/m    Wt Readings from Last 3 Encounters:  03/29/23 173 lb (78.5 kg)  03/26/23 172 lb 6.4 oz (78.2 kg)  03/22/23 171 lb 4 oz (77.7 kg)    Physical Exam Vitals and nursing note reviewed.  Constitutional:      Appearance: Normal appearance. She is obese.  HENT:     Head: Atraumatic.     Right Ear: Tympanic membrane and external ear normal. No foreign body.     Left Ear: Hearing, tympanic membrane, ear canal and external ear normal.     Ears:     Comments: Small abrasion to right ear canal    Nose: Nose normal.     Mouth/Throat:     Mouth: Mucous membranes are moist.     Pharynx: Oropharynx is clear.  Eyes:     Conjunctiva/sclera: Conjunctivae normal.     Pupils: Pupils are equal, round, and reactive to light.  Cardiovascular:     Rate and Rhythm: Normal rate and regular rhythm.  Pulmonary:     Effort: Pulmonary effort is normal.  Skin:    General: Skin is warm and dry.     Capillary Refill: Capillary refill takes  less than 2 seconds.  Neurological:     General: No focal deficit present.     Mental Status: She is alert and oriented to person, place, and time.  Psychiatric:        Mood and Affect: Mood normal.        Behavior: Behavior normal.        Thought Content: Thought content normal.        Judgment: Judgment normal.     Results for orders placed or performed during the hospital encounter of 03/26/23  Surgical pathology  Result Value Ref Range   SURGICAL PATHOLOGY      SURGICAL PATHOLOGY CASE: [id number] PATIENT: Marylouise Stacks Surgical Pathology Report     Clinical History: bilateral breast cancer (cm)      FINAL MICROSCOPIC DIAGNOSIS:  A. BREAST, RIGHT, LUMPECTOMY: - Invasive ductal carcinoma, 2.0 cm, grade 2 - High-grade ductal carcinoma in situ with necrosis - Resection margins are negative for carcinoma - Biopsy site changes - See oncology table  B. BREAST, LEFT, LUMPECTOMY: - Invasive ductal carcinoma, 2.5 cm, grade 2 (Venus and coil clips) - Separate small satellite focus of invasive ductal carcinoma, 0.8 cm (spiral clip), see comment - Resection margins are negative for carcinoma; closest is the anterior margin at 0.2 cm - Lymphovascular invasion is identified - Biopsy site changes - See oncology table  C. BREAST, LEFT ADDITIONAL MEDIAL MARGIN, EXCISION: - Fibrocystic change, negative for carcinoma  D. BREAST, LEFT ADDITIONAL POSTERIOR MARGIN, EXCISION: - Benign breast parenchyma with no specific histopat hologic changes - Negative for carcinoma  E. BREAST, RIGHT ADDITIONAL MEDIAL MARGIN, EXCISION: - Benign breast parenchyma with no specific histopathologic changes - Negative for carcinoma  F. BREAST, RIGHT ADDITIONAL ANTERIOR MARGIN, EXCISION: - Skin with underlying unremarkable breast parenchyma - Negative for carcinoma      COMMENT:  B.  The separate satellite focus shows  a histomorphology identical with the larger lesion and is located  1.5 cm from it.  Small satellite foci within the nearby breast parenchyma with an identical histomorphologic appearance do not alter the tumor stage.    ONCOLOGY TABLE:  A.  Procedure: Lumpectomy Specimen Laterality: Right Histologic Type: Invasive ductal carcinoma Histologic Grade:      Glandular (Acinar)/Tubular Differentiation: 2      Nuclear Pleomorphism: 3      Mitotic Rate: 1      Overall Grade: 2 Tumor Size: 2.0 cm Ductal Carcinoma In Situ: Present, high-grade with necrosis Treatment Effect in the Breast: N o known presurgical therapy Margins: All margins negative for invasive carcinoma      Distance from Closest Margin (mm): 13 mm DCIS Margins: Uninvolved by DCIS      Distance from Closest Margin (mm): 13 mm Regional Lymph Nodes: Not applicable (no lymph nodes submitted or found) Distant Metastasis:      Distant Site(s) Involved: Not applicable Breast Biomarker Testing Performed on Previous Biopsy:      Testing Performed on Case Number: [id number]            Estrogen Receptor: 95%, positive, strong staining intensity            Progesterone Receptor: 100%, positive, strong staining intensity            HER2: Negative (FISH)            Ki-67: 10% Pathologic Stage Classification (pTNM, AJCC 8th Edition): pT1c, pN not assigned Representative Tumor Block: A4 Comment(s): None  (v4.5.0.0)  B.  Procedure: Lumpectomy Specimen Laterality: Left Histologic Type: Invasive ductal carcinoma Histologic Grade:      Glandular (Acinar)/Tubular Differentiation: 3      Nuclear  Pleomorphism: 2      Mitotic Rate: 1      Overall Grade: 2 Tumor Size: 2.5 cm (lesion with Venus and coil clips) Ductal Carcinoma In Situ: Present, intermediate grade Treatment Effect in the Breast: No known presurgical therapy Margins: All margins negative for invasive carcinoma      Distance from Closest Margin (mm): 2 mm      Specify Closest Margin (required only if <46mm): Anterior  margin DCIS Margins: Uninvolved by DCIS      Distance from Closest Margin (mm): Cannot be determined Regional Lymph Nodes: Not applicable (no lymph nodes submitted or found) Distant Metastasis:      Distant Site(s) Involved: Not applicable Breast Biomarker Testing Performed on Previous Biopsy:      Testing Performed on Case Number: [id number]            Estrogen Receptor: 95%, positive, strong staining intensity            Progesterone Receptor: 100%, positive, strong staining intensity            HER2: Negative (1+)            Ki-67: 5% Pathologic Stage Classification (pTNM, AJCC  8th Edition): pT2, pN not assigned Representative Tumor Block: B4 Comment(s): Separate satellite nodule, 0.8 cm (spiral clip)  (v4.5.0.0)    GROSS DESCRIPTION:  A. Specimen type: Received fresh is a clinically right lumpectomy specimen placed in formalin at 9:11 AM on 03/26/2023.  The specimen is received with 2 radiographic seeds that are identified and removed. Size: 7.7 x 5.1 x 3.2 cm (S-I x A-P x M-L) Orientation: Anterior = green, posterior = black, superior = red, inferior = blue, medial = yellow, and lateral = orange Cut surface: The  specimen is sectioned from superior to inferior into Faxitron images taken revealing a coil clip within a white-tan, firm, poorly defined lesion measuring 2.0 x 1.7 x 1.6 cm. Margins: The lesion abuts the anterior margin, is 0.7 cm from the medial margin, 1.3 cm from the posterior margin, 2.4 cm from the lateral margin, 2.5 cm from the superior margin, 3.5 cm from the inferior margin. Block summary: A1 lesion to anterior mar gin A2 lesion to medial margin A3 central lesion at clip site, no true margin A4 additional central lesion, no true margin A5 lateral margin closest to lesion A6 posterior margin closest to lesion A7 Superior margin closest to lesion A8 inferior margin closest to lesion   B. Specimen type: Received fresh with 2 radiographic seeds  that are identified and removed is a clinically left lumpectomy specimen.  The specimen is placed in formalin at 9:18 AM on 03/26/2023. Size: 7.2 x 6.5 x 3.0 cm (S-I x M-L x A-P) Orientation: Anterior = green, posterior = black, superior = red, inferior = blue, medial = yellow, and lateral = orange Cut surface: The specimen is sectioned from superior to inferior and a Faxitron image is taken revealing a Venus and coil clip within a single 2.5 x 2.0 x 1.3 cm white-tan, firm, poorly defined lesion.  Imaging shows a spiral shaped clip located 1.5 cm from the lesion.  The spiral shaped clip is within a hemorrhagic, minimally fibrotic area measur ing 0.8 x 0.5 x 0.3 cm. Margins: The lesion is 0.3 cm from the medial margin, 0.7 cm from the anterior margin, 1.5 cm from the lateral margin, 1.0 cm from the posterior margin, 1.4 cm from the superior margin, and 3.1 cm from the inferior margin.  Spiral clip site is 1.5 cm from the lateral margin, 2.2 cm from the posterior margin, 2.5 cm from the medial margin, 1.4 cm from the anterior margin, 1.5 cm from the inferior margin, and 5.0 cm from the superior margin. Block summary: B1 lesion at coil clip, central, no true margin B2 lesion between clip sites B3 lesion at [facility] clip B4 medial margin closest to lesion B5 anterior margin closest to lesion B6 lateral margin closest to lesion B7 posterior margin closest to lesion B8 superior margin closest to all clip sites B9 inferior margin closest to LESION B10 tissue between lesion and spiral clip tissue B11 tissue at spiral clip B12 lateral margin closest to spiral clip B13 medial margin closest to spiral clip B14 ant erior margin closest to spiral clip B15 posterior margin closest to biopsy clip   C.  Received fresh is "left breast additional medial margin".  The new margin is inked yellow and the opposite aspect is inked black.  The specimen measures 3.0 x 2.2 cm and is excised to depth of 0.8  cm. Sectioning reveals an underlying fatty, homogenous cut surface.  The specimen is entirely submitted in C1-C6.  D.  Received fresh is "left breast additional posterior margin".  The new margin is inked black and the opposite aspect is inked red.  The specimen measures 4.1 x 3.0 cm and is excised to depth of 1.4 cm.  The specimen is serially sectioned revealing an underlying fatty, homogenous cut surface.  The specimen is entirely submitted in D1-D6.  E.  Received fresh is "right breast additional medial margin".  The new margin is inked yellow and the opposite aspect is inked black.  The specimen measures 3.7 x 3.2 and is excised to a depth of  1.2 cm.  The specimen is serially sectioned  revealing an underlying fatty, homogenous cut surface.  The specimen is entirely submitted in E1-E8.  F.  Received fresh is "right breast additional anterior margin".  The specimen consists of a 4.4 x 1.4 cm portion of well lobulated yellow adipose with an attached 2.5 x 1.0 cm ellipse of skin.  The specimen is excised to a depth of 2.0 cm.  The cutaneous surface is tan and wrinkled without distinct lesions.  The anterior surface surrounding the skin is inked green.  The opposite aspect is inked black.  The specimen is serially sectioned revealing an underlying fatty, homogenous cut surface.  The specimen is entirely submitted in F1-F6. (KW, 03/26/2023)   Final Diagnosis performed by Holley Bouche, MD.   Electronically signed 03/27/2023 Technical component performed at [facility]. [facility], [address] [place], [place] [address].  Professional component performed at [facility], [address] [place], [place] [address].  Immunohis Barrister's clerk component (if applicable) was performed at [facility]. [address], [place], [place] [address].   IMMUNOHISTOCHEMISTRY DISCLAIMER (if applicable): Some of these  immunohistochemical stains may have been developed and the performance characteristics determine by [facility]. Some may not have been cleared or approved by the U.S. Food and Drug Administration. The FDA has determined that such clearance or approval is not necessary. This test is used for clinical purposes. It should not be regarded as investigational or for research. This laboratory is certified under the Clinical Laboratory Improvement Amendments of 1988 (CLIA-88) as qualified to perform high complexity clinical laboratory testing.  The controls stained appropriately.   IHC stains are performed on formalin fixed, paraffin embedded tissue using a 3,3"diaminobenzidine (DAB) chromogen and Leica Bond Autostainer System. The staining intensity of t he nucleus is score manually and is reported as the percentage of tumor cell nuclei demonstrating specific nuclear staining. The specimens are fixed in 10% Neutral Formalin for at least 6 hours and up to 72hrs. These tests are validated on decalcified tissue. Results should be interpreted with caution given the possibility of false negative results on decalcified specimens. Antibody Clones are as follows ER-clone 35F, PR-clone 16, Ki67- clone MM1. Some of these immunohistochemical stains may have been developed and the performance characteristics determined by [facility].        Pertinent labs & imaging results that were available during my care of the patient were reviewed by me and considered in my medical decision making.  Assessment & Plan:  Baye was seen today for inside of right ear sore.  Diagnoses and all orders for this visit:  Abrasion of right ear canal, initial encounter Small abrasion to right ear canal, no sings of infection. No foreign bodies. Symptomatic care discussed in detail.     Continue all other maintenance medications.  Follow up plan: Return if symptoms worsen or fail to  improve.   Continue healthy lifestyle choices, including diet (rich in fruits, vegetables, and lean proteins, and low in salt and simple carbohydrates) and exercise (at least 30 minutes of moderate physical activity daily).    The above assessment and management plan was discussed with the patient. The patient verbalized understanding of and has agreed to the management plan. Patient is aware to call the clinic if they develop any new symptoms or if symptoms persist or worsen. Patient is aware when to return to the clinic for a follow-up visit. Patient educated on when  it is appropriate to go to the emergency department.   Kari Baars, FNP-C [facility] [phone]

## 2023-04-03 ENCOUNTER — Inpatient Hospital Stay: Payer: Medicare HMO | Attending: Hematology and Oncology | Admitting: Hematology and Oncology

## 2023-04-03 ENCOUNTER — Other Ambulatory Visit: Payer: Self-pay

## 2023-04-03 VITALS — BP 162/50 | HR 62 | Temp 97.9°F | Resp 18 | Ht 59.0 in | Wt 171.5 lb

## 2023-04-03 DIAGNOSIS — Z170 Estrogen receptor positive status [ER+]: Secondary | ICD-10-CM | POA: Diagnosis not present

## 2023-04-03 DIAGNOSIS — C50411 Malignant neoplasm of upper-outer quadrant of right female breast: Secondary | ICD-10-CM | POA: Diagnosis present

## 2023-04-03 DIAGNOSIS — Z79811 Long term (current) use of aromatase inhibitors: Secondary | ICD-10-CM | POA: Diagnosis not present

## 2023-04-03 MED ORDER — ANASTROZOLE 1 MG PO TABS: 1.0000 mg | ORAL_TABLET | Freq: Every day | ORAL | 3 refills | Status: DC

## 2023-04-03 NOTE — Assessment & Plan Note (Signed)
Right lumpectomy: Grade 2 IDC 2 cm with high-grade DCIS, margins negative, ER 95%, PR 100%, HER2 negative, Ki-67 10% Left lumpectomy: Grade 2 IDC 2.5 cm, separate satellite focus IDC 0.8 cm, margins negative, lymphovascular invasion identified, ER 95%, PR 100%, HER2 1+ negative, Ki-67 5%  Pathology counseling: I discussed the final pathology report of the patient provided  a copy of this report. I discussed the margins as well as lymph node surgeries. We also discussed the final staging along with previously performed ER/PR and HER-2/neu testing.  Treatment plan: Adjuvant radiation therapy (will discuss in tumor board) Adjuvant antiestrogen therapy  Patient is in excellent physical condition. Return to clinic after radiation is complete.

## 2023-04-04 ENCOUNTER — Encounter: Payer: Self-pay | Admitting: *Deleted

## 2023-04-15 ENCOUNTER — Telehealth: Payer: Self-pay | Admitting: *Deleted

## 2023-04-15 ENCOUNTER — Encounter: Payer: Self-pay | Admitting: *Deleted

## 2023-04-15 DIAGNOSIS — Z170 Estrogen receptor positive status [ER+]: Secondary | ICD-10-CM

## 2023-04-15 NOTE — Telephone Encounter (Signed)
Late entry from 6/13 -Spoke with patient's daughter Richarda Osmond to let her know that Dr. Basilio Cairo would recommend xrt. Patient will be out of town until September. Dr. Basilio Cairo ok with waiting and informed her she would get a call to schedule an appt after 9/24 - which is when [facility] stated she will be back.

## 2023-06-18 ENCOUNTER — Encounter: Payer: Self-pay | Admitting: *Deleted

## 2023-07-09 NOTE — Progress Notes (Addendum)
Location of Breast Cancer: Malignant neoplasm of upper-outer quadrant of right breast in female, estrogen receptor positive (HCC)   Histology per Pathology Report:  01-25-23         Receptor Status:  Right ER(95%), PR (20%), Her2-neu (neg), Ki-67() - Left ER (), PR (100%), Her2-neu (neg), Ki-67 (5%)   Did patient present with symptoms (if so, please note symptoms) or was this found on screening mammography?:  palpated right breast mass   Past/Anticipated interventions by surgeon, if any: 03/26/2023 --Dr. [name]  Scheduled for:  RIGHT BREAST SEED BRACKETED LUMPECTOMY LEFT BREAST SEED BRACKETED LUMPECTOMY    Dr. Dwain Sarna note on 02-20-23 Assessment and Plan:   Diagnoses and all orders for this visit:  Malignant neoplasm of central portion of both breasts in female, estrogen receptor positive (CMS/HHS-HCC)   Bilateral seed bracketed lumpectomies (pending another left breast biopsy)  We discussed the staging and pathophysiology of breast cancer. We discussed all of the different options for treatment for breast cancer including surgery, chemotherapy, radiation therapy, Herceptin, and antiestrogen therapy.  She asked about no surgery. This would be reasonable I think if she were not so healthy. Has multiple family members who lived over 31.   I do not think she needs lymph node biopsies.  We discussed the options for treatment of the breast cancer which included lumpectomy versus a mastectomy. We discussed the performance of the lumpectomy with radioactive seed placement. We discussed a 5-10% chance of a positive margin requiring reexcision in the operating room. We also discussed that she will likely need radiation therapy if she undergoes lumpectomy. We discussed mastectomy and the postoperative care for that as well. Mastectomy can be followed by reconstruction. The decision for lumpectomy vs mastectomy has no impact on decision for chemotherapy. Most mastectomy  patients will not need radiation therapy. We discussed that there is no difference in her survival whether she undergoes lumpectomy with radiation therapy or antiestrogen therapy versus a mastectomy. There is also no real difference between her recurrence in the breast.  I think we can do bracketed lumpectomies on both sides. I will schedule her but needs one additional left sided biopsy to be complete.  We discussed the risks of operation including bleeding, infection, possible reoperation. She understands her further therapy will be based on what her stages at the time of her operation.    Past/Anticipated interventions by medical oncology, if any:  Serena Croissant, MD 04/03/2023  SUMMARY OF ONCOLOGIC HISTORY:    Oncology History  Malignant neoplasm of upper-outer quadrant of right breast in female, estrogen receptor positive (HCC)  01/25/2023 Initial Diagnosis    Palpable abnormality right breast retroareolar 11 o'clock position: 1.4 cm, adjacent mass 0.4 cm, axilla negative: Biopsy grade 2 IDC ER 95%, PR 20%, HER2 2+ by IHC FISH negative Left breast spiculated mass 7 mm at 1:00, 1.6 cm at 12:00, 2 masses at 11 o'clock position biopsy revealed grade 2 invasive mammary cancer with papillary features ER?,  PR 100%, Ki67 5%, HER2 2+ by IHC, FISH negative 0.7 cm (benign) and 0.5 cm (not biopsied)    03/26/2023 Surgery    Right lumpectomy: Grade 2 IDC 2 cm with high-grade DCIS, margins negative, ER 95%, PR 100%, HER2 negative, Ki-67 10% Left lumpectomy: Grade 2 IDC 2.5 cm, separate satellite focus IDC 0.8 cm, margins negative, lymphovascular invasion identified, ER 95%, PR 100%, HER2 1+ negative, Ki-67 5%    ASSESSMENT & PLAN:  Malignant neoplasm of upper-outer quadrant of right breast in  female, estrogen receptor positive (HCC) Right lumpectomy: Grade 2 IDC 2 cm with high-grade DCIS, margins negative, ER 95%, PR 100%, HER2 negative, Ki-67 10% Left lumpectomy: Grade 2 IDC 2.5 cm, separate satellite  focus IDC 0.8 cm, margins negative, lymphovascular invasion identified, ER 95%, PR 100%, HER2 1+ negative, Ki-67 5%   Pathology counseling: I discussed the final pathology report of the patient provided  a copy of this report. I discussed the margins as well as lymph node surgeries. We also discussed the final staging along with previously performed ER/PR and HER-2/neu testing.   Treatment plan: Adjuvant radiation therapy (will discuss in tumor board): She is going away for 3 months and would like to do radiation after she comes back in September if that is what we recommend.  I sent a message to Dr. Basilio Cairo. Adjuvant antiestrogen therapy: With anastrozole started 04/03/2023   Patient is in excellent physical condition. Return to clinic in 4 months for survivorship care plan visit   Lymphedema issues, if any:  none   Pain issues, if any:  denies  SAFETY ISSUES: Prior radiation? no Pacemaker/ICD? no Possible current pregnancy?no Is the patient on methotrexate? no  Current Complaints / other details:  No major concerns at this time. She is ready to get started.   Vitals:   07/23/23 1228  BP: (!) 190/55  Pulse: 73  Resp: 12  Temp: 97.7 F (36.5 C)  SpO2: 98%

## 2023-07-22 NOTE — Progress Notes (Signed)
Radiation Oncology         [phone] ________________________________  Name: Carolyn Parsons MRN: [id number]  Date: 07/23/2023  DOB: 1932/06/21  Follow-Up Visit Note  Outpatient  CC: Carolyn Spencer, FNP  Carolyn Croissant, MD  Diagnosis:   No diagnosis found.   Bilateral Breast Cancers:  -- Stage IA Right Breast UOQ, Invasive ductal carcinoma, ER+ / PR+ / Her2-, Grade 2: s/p lumpectomy w/o nodal biopsies  -- Stage IA Overlapping sites of the Left Breast, Invasive ductal carcinoma along with a separate small satellite focus of invasive ductal carcinoma , ER+ / PR+ / Her2-, Grade 2: s/p lumpectomy w/o nodal biopsies   CHIEF COMPLAINT: Here to discuss management of bilateral breast cancers  Narrative:  The patient returns today for follow-up.     Since her consultation date of 03/22/23, she opted to proceed with bilateral breast lumpectomies without nodal biopsies on 03/26/23 under the care of Dr. Dwain Sarna. Pathology from the procedure revealed:  -- Right breast : tumor size of 2.0 cm; histology of grade 2 invasive ductal carcinoma with high grade DCIS and necrosis; all margins negative for invasive and in situ carcinoma; margin status to invasive and in situ disease of 13 mm from the closest margin; no lymph nodes were examined;  ER status: 95% positive and PR status 100% positive, both with strong staining intensity; Proliferation marker Ki67 at 10%; Her2 status negative; Grade 2. -- Left Breast : histology of grade 2 invasive ductal carcinoma measuring 2.5 cm in the greatest extent, along with a separate small satellite focus of invasive ductal carcinoma measuring 0.8 cm; positive for LVI; all margins negative for invasive carcinoma; margin status to invasive disease of 0.2 cm from the anterior margin; no lymph nodes were examined;  ER status: 95% positive and PR status 100% positive, both with strong staining intensity; Proliferation marker Ki67 at 5%; Her2 status negative; Grade 2.  Based  on Dr. Earmon Phoenix recommendation, she has opted to proceed with antiestrogen therapy consisting of anastrozole - first dose on 04/03/23. (She was out of town for the last 3 months and was cleared to start anastrozole prior to radiation).   Symptomatically, the patient reports: ***        ALLERGIES:  is allergic to amoxicillin and penicillins.  Meds: Current Outpatient Medications  Medication Sig Dispense Refill   amLODipine (NORVASC) 5 MG tablet Take 1 tablet (5 mg total) by mouth daily. 90 tablet 3   anastrozole (ARIMIDEX) 1 MG tablet Take 1 tablet (1 mg total) by mouth daily. 90 tablet 3   brimonidine-timolol (COMBIGAN) 0.2-0.5 % ophthalmic solution INSTILL 1 DROP INTO BOTH  EYES EVERY 12 HOURS 25 mL 3   fexofenadine (ALLEGRA) 180 MG tablet Take 1 tablet (180 mg total) by mouth daily. 90 tablet 2   fluticasone (FLONASE) 50 MCG/ACT nasal spray Place 1 spray into both nostrils daily. 48 g 3   levothyroxine (SYNTHROID) 50 MCG tablet Take 1 tablet (50 mcg total) by mouth daily. 90 tablet 3   metoprolol succinate (TOPROL-XL) 25 MG 24 hr tablet Take 1 tablet (25 mg total) by mouth daily. 90 tablet 3   pantoprazole (PROTONIX) 40 MG tablet Take 1 tablet (40 mg total) by mouth daily. 90 tablet 3   rosuvastatin (CRESTOR) 20 MG tablet Take 1 tablet (20 mg total) by mouth daily. 90 tablet 3   No current facility-administered medications for this encounter.    Physical Findings:  vitals were not taken for this visit. Marland Kitchen  General: Alert and oriented, in no acute distress HEENT: Head is normocephalic. Extraocular movements are intact. Oropharynx is clear. Neck: Neck is supple, no palpable cervical or supraclavicular lymphadenopathy. Heart: Regular in rate and rhythm with no murmurs, rubs, or gallops. Chest: Clear to auscultation bilaterally, with no rhonchi, wheezes, or rales. Abdomen: Soft, nontender, nondistended, with no rigidity or guarding. Extremities: No cyanosis or edema. Lymphatics: see  Neck Exam Musculoskeletal: symmetric strength and muscle tone throughout. Neurologic: No obvious focalities. Speech is fluent.  Psychiatric: Judgment and insight are intact. Affect is appropriate. Breast exam reveals ***  Lab Findings: Lab Results  Component Value Date   WBC 7.6 02/26/2023   HGB 14.3 02/26/2023   HCT 43.8 02/26/2023   MCV 92 02/26/2023   PLT 185 02/26/2023    @LASTCHEMISTRY @  Radiographic Findings: No results found.  Impression/Plan: We discussed adjuvant radiotherapy today.  I recommend *** in order to ***.  I reviewed the logistics, benefits, risks, and potential side effects of this treatment in detail. Risks may include but not necessary be limited to acute and late injury tissue in the radiation fields such as skin irritation (change in color/pigmentation, itching, dryness, pain, peeling). She may experience fatigue. We also discussed possible risk of long term cosmetic changes or scar tissue. There is also a smaller risk for lung toxicity, ***cardiac toxicity, ***brachial plexopathy, ***lymphedema, ***musculoskeletal changes, ***rib fragility or ***induction of a second malignancy, ***late chronic non-healing soft tissue wound.    The patient asked good questions which I answered to her satisfaction. She is enthusiastic about proceeding with treatment. A consent form has been *** signed and placed in her chart.  A total of *** medically necessary complex treatment devices will be fabricated and supervised by me: *** fields with MLCs for custom blocks to protect heart, and lungs;  and, a Vac-lok. MORE COMPLEX DEVICES MAY BE MADE IN DOSIMETRY FOR FIELD IN FIELD BEAMS FOR DOSE HOMOGENEITY.  I have requested : 3D Simulation which is medically necessary to give adequate dose to at risk tissues while sparing lungs and heart.  I have requested a DVH of the following structures: lungs, heart, *** lumpectomy cavity.    The patient will receive *** Gy in *** fractions to the  *** with *** fields.  This will be *** followed by a boost.  On date of service, in total, I spent *** minutes on this encounter. Patient was seen in person.  _____________________________________   Lonie Peak, MD  This document serves as a record of services personally performed by Lonie Peak, MD. It was created on her behalf by Neena Rhymes, a trained medical scribe. The creation of this record is based on the scribe's personal observations and the provider's statements to them. This document has been checked and approved by the attending provider.

## 2023-07-23 ENCOUNTER — Encounter: Payer: Self-pay | Admitting: Radiation Oncology

## 2023-07-23 ENCOUNTER — Ambulatory Visit
Admission: RE | Admit: 2023-07-23 | Discharge: 2023-07-23 | Disposition: A | Discharge status: Home / Self Care | Payer: Medicare HMO | Source: Ambulatory Visit | Attending: Radiation Oncology | Admitting: Radiation Oncology

## 2023-07-23 VITALS — BP 160/49 | HR 62 | Temp 97.7°F | Resp 12 | Ht 59.0 in | Wt 171.6 lb

## 2023-07-23 DIAGNOSIS — Z170 Estrogen receptor positive status [ER+]: Secondary | ICD-10-CM

## 2023-07-23 DIAGNOSIS — C50411 Malignant neoplasm of upper-outer quadrant of right female breast: Secondary | ICD-10-CM | POA: Diagnosis present

## 2023-07-23 DIAGNOSIS — Z79899 Other long term (current) drug therapy: Secondary | ICD-10-CM | POA: Insufficient documentation

## 2023-07-23 DIAGNOSIS — C50812 Malignant neoplasm of overlapping sites of left female breast: Secondary | ICD-10-CM | POA: Diagnosis not present

## 2023-07-23 DIAGNOSIS — Z79811 Long term (current) use of aromatase inhibitors: Secondary | ICD-10-CM | POA: Diagnosis not present

## 2023-07-24 ENCOUNTER — Encounter: Payer: Self-pay | Admitting: Radiation Oncology

## 2023-07-30 ENCOUNTER — Encounter: Payer: Self-pay | Admitting: *Deleted

## 2023-07-30 DIAGNOSIS — C50411 Malignant neoplasm of upper-outer quadrant of right female breast: Secondary | ICD-10-CM

## 2023-07-31 DIAGNOSIS — Z510 Encounter for antineoplastic radiation therapy: Secondary | ICD-10-CM | POA: Insufficient documentation

## 2023-07-31 DIAGNOSIS — C50812 Malignant neoplasm of overlapping sites of left female breast: Secondary | ICD-10-CM | POA: Insufficient documentation

## 2023-08-05 ENCOUNTER — Telehealth: Payer: Self-pay

## 2023-08-05 ENCOUNTER — Telehealth: Payer: Self-pay | Admitting: Radiation Oncology

## 2023-08-05 NOTE — Telephone Encounter (Signed)
Pt family called to let nursing staff know what her mom was having allergy symptoms with no fever. RN encouraged pt to take a Covid test to rule out Covid. Rn also educated pt family that she can come with these symptoms but we would request her wear a mask to treatment. Family will call RN if Covid test is positive.

## 2023-08-05 NOTE — Telephone Encounter (Signed)
Pt's daughter called asking about protocol with sick pts/ Daughter states she just got home and pt has a cough, no fever or other symptoms at this time. Call was transferred to RN Hyacinth Meeker to discuss more details.

## 2023-08-06 ENCOUNTER — Ambulatory Visit
Admission: RE | Admit: 2023-08-06 | Discharge: 2023-08-06 | Disposition: A | Discharge status: Home / Self Care | Payer: Medicare HMO | Source: Ambulatory Visit | Attending: Radiation Oncology | Admitting: Radiation Oncology

## 2023-08-06 ENCOUNTER — Other Ambulatory Visit: Payer: Self-pay

## 2023-08-06 DIAGNOSIS — Z510 Encounter for antineoplastic radiation therapy: Secondary | ICD-10-CM | POA: Diagnosis not present

## 2023-08-06 DIAGNOSIS — C50812 Malignant neoplasm of overlapping sites of left female breast: Secondary | ICD-10-CM

## 2023-08-06 LAB — RAD ONC ARIA SESSION SUMMARY
Course Elapsed Days: 0
Plan Fractions Treated to Date: 1
Plan Fractions Treated to Date: 1
Plan Prescribed Dose Per Fraction: 5.7 Gy
Plan Prescribed Dose Per Fraction: 5.7 Gy
Plan Total Fractions Prescribed: 5
Plan Total Fractions Prescribed: 5
Plan Total Prescribed Dose: 28.5 Gy
Plan Total Prescribed Dose: 28.5 Gy
Reference Point Dosage Given to Date: 5.7 Gy
Reference Point Dosage Given to Date: 5.7 Gy
Reference Point Session Dosage Given: 5.7 Gy
Reference Point Session Dosage Given: 5.7 Gy
Session Number: 1

## 2023-08-06 MED ORDER — RADIAPLEXRX EX GEL: Freq: Once | CUTANEOUS | Status: AC

## 2023-08-13 ENCOUNTER — Ambulatory Visit
Admission: RE | Admit: 2023-08-13 | Discharge: 2023-08-13 | Disposition: A | Discharge status: Home / Self Care | Payer: Medicare HMO | Source: Ambulatory Visit | Attending: Radiation Oncology | Admitting: Radiation Oncology

## 2023-08-13 ENCOUNTER — Other Ambulatory Visit: Payer: Self-pay

## 2023-08-13 DIAGNOSIS — Z510 Encounter for antineoplastic radiation therapy: Secondary | ICD-10-CM | POA: Diagnosis not present

## 2023-08-13 LAB — RAD ONC ARIA SESSION SUMMARY
Course Elapsed Days: 7
Plan Fractions Treated to Date: 2
Plan Fractions Treated to Date: 2
Plan Prescribed Dose Per Fraction: 5.7 Gy
Plan Prescribed Dose Per Fraction: 5.7 Gy
Plan Total Fractions Prescribed: 5
Plan Total Fractions Prescribed: 5
Plan Total Prescribed Dose: 28.5 Gy
Plan Total Prescribed Dose: 28.5 Gy
Reference Point Dosage Given to Date: 11.4 Gy
Reference Point Dosage Given to Date: 11.4 Gy
Reference Point Session Dosage Given: 5.7 Gy
Reference Point Session Dosage Given: 5.7 Gy
Session Number: 2

## 2023-08-16 ENCOUNTER — Encounter: Payer: Medicare HMO | Admitting: Adult Health

## 2023-08-20 ENCOUNTER — Ambulatory Visit
Admission: RE | Admit: 2023-08-20 | Discharge: 2023-08-20 | Disposition: A | Discharge status: Home / Self Care | Payer: Medicare HMO | Source: Ambulatory Visit | Attending: Radiation Oncology

## 2023-08-20 ENCOUNTER — Other Ambulatory Visit: Payer: Self-pay

## 2023-08-20 DIAGNOSIS — Z510 Encounter for antineoplastic radiation therapy: Secondary | ICD-10-CM | POA: Diagnosis not present

## 2023-08-20 LAB — RAD ONC ARIA SESSION SUMMARY
Course Elapsed Days: 14
Plan Fractions Treated to Date: 3
Plan Fractions Treated to Date: 3
Plan Prescribed Dose Per Fraction: 5.7 Gy
Plan Prescribed Dose Per Fraction: 5.7 Gy
Plan Total Fractions Prescribed: 5
Plan Total Fractions Prescribed: 5
Plan Total Prescribed Dose: 28.5 Gy
Plan Total Prescribed Dose: 28.5 Gy
Reference Point Dosage Given to Date: 17.1 Gy
Reference Point Dosage Given to Date: 17.1 Gy
Reference Point Session Dosage Given: 5.7 Gy
Reference Point Session Dosage Given: 5.7 Gy
Session Number: 3

## 2023-08-22 ENCOUNTER — Inpatient Hospital Stay: Payer: Medicare HMO | Attending: Adult Health | Admitting: Adult Health

## 2023-08-22 ENCOUNTER — Other Ambulatory Visit: Payer: Self-pay

## 2023-08-22 ENCOUNTER — Encounter: Payer: Self-pay | Admitting: Adult Health

## 2023-08-22 VITALS — BP 164/55 | HR 71 | Temp 97.9°F | Resp 18 | Ht 59.0 in | Wt 166.4 lb

## 2023-08-22 DIAGNOSIS — Z170 Estrogen receptor positive status [ER+]: Secondary | ICD-10-CM | POA: Insufficient documentation

## 2023-08-22 DIAGNOSIS — C50812 Malignant neoplasm of overlapping sites of left female breast: Secondary | ICD-10-CM

## 2023-08-22 DIAGNOSIS — Z79811 Long term (current) use of aromatase inhibitors: Secondary | ICD-10-CM | POA: Insufficient documentation

## 2023-08-22 DIAGNOSIS — C50411 Malignant neoplasm of upper-outer quadrant of right female breast: Secondary | ICD-10-CM | POA: Diagnosis not present

## 2023-08-22 NOTE — Progress Notes (Signed)
SURVIVORSHIP VISIT:  BRIEF ONCOLOGIC HISTORY:  Oncology History  Malignant neoplasm of upper-outer quadrant of right breast in female, estrogen receptor positive (HCC)  01/25/2023 Initial Diagnosis   Palpable abnormality right breast retroareolar 11 o'clock position: 1.4 cm, adjacent mass 0.4 cm, axilla negative: Biopsy grade 2 IDC ER 95%, PR 20%, HER2 2+ by IHC FISH negative Left breast spiculated mass 7 mm at 1:00, 1.6 cm at 12:00, 2 masses at 11 o'clock position biopsy revealed grade 2 invasive mammary cancer with papillary features ER?,  PR 100%, Ki67 5%, HER2 2+ by IHC, FISH negative 0.7 cm (benign) and 0.5 cm (not biopsied)   03/26/2023 Surgery   Right lumpectomy: Grade 2 IDC 2 cm with high-grade DCIS, margins negative, ER 95%, PR 100%, HER2 negative, Ki-67 10% Left lumpectomy: Grade 2 IDC 2.5 cm, separate satellite focus IDC 0.8 cm, margins negative, lymphovascular invasion identified, ER 95%, PR 100%, HER2 1+ negative, Ki-67 5%   03/2023 -  Anti-estrogen oral therapy   Anastrozole   08/06/2023 - 08/20/2023 Radiation Therapy   Right and Left Breast: 17.1 Gy in 3 treatments     INTERVAL HISTORY:  Carolyn Parsons to review her survivorship care plan detailing her treatment course for breast cancer, as well as monitoring long-term side effects of that treatment, education regarding health maintenance, screening, and overall wellness and health promotion.     Overall, Carolyn Parsons reports feeling quite well.  She is taking anastrozole daily and denies hot flashes, arthralgias, or vaginal dryness.  She takes every evening.  Her most recent bone density occurred 10+ years ago. Her PCP [facility] can complete her bone density test in their practice and she would prefer to go there for the testing.    REVIEW OF SYSTEMS:  Review of Systems  Constitutional:  Negative for appetite change, chills, fatigue, fever and unexpected weight change.  HENT:   Negative for hearing loss,  lump/mass and trouble swallowing.   Eyes:  Negative for eye problems and icterus.  Respiratory:  Negative for chest tightness, cough and shortness of breath.   Cardiovascular:  Negative for chest pain, leg swelling and palpitations.  Gastrointestinal:  Negative for abdominal distention, abdominal pain, constipation, diarrhea, nausea and vomiting.  Endocrine: Negative for hot flashes.  Genitourinary:  Negative for difficulty urinating.   Musculoskeletal:  Negative for arthralgias.  Skin:  Negative for itching and rash.  Neurological:  Negative for dizziness, extremity weakness, headaches and numbness.  Hematological:  Negative for adenopathy. Does not bruise/bleed easily.  Psychiatric/Behavioral:  Negative for depression. The patient is not nervous/anxious.   Breast: Denies any new nodularity, masses, tenderness, nipple changes, or nipple discharge.       PAST MEDICAL/SURGICAL HISTORY:  Past Medical History:  Diagnosis Date   Cataract    GERD (gastroesophageal reflux disease)    Hyperlipidemia    Hypertension    Past Surgical History:  Procedure Laterality Date   APPENDECTOMY     BLADDER REPAIR     BREAST BIOPSY Left 01/25/2023   [place] LT BREAST BX W LOC DEV EA ADD LESION IMG BX SPEC US GUIDE 01/25/2023 [facility]   BREAST BIOPSY Left 01/25/2023   [place] LT BREAST BX W LOC DEV 1ST LESION IMG BX SPEC US GUIDE 01/25/2023 [facility]   BREAST BIOPSY Left 01/25/2023   [place] LT BREAST BX W LOC DEV EA ADD LESION IMG BX SPEC US GUIDE 01/25/2023 [facility]   BREAST BIOPSY Right 01/25/2023   [place] RT BREAST  BX W LOC DEV 1ST LESION IMG BX SPEC US GUIDE 01/25/2023 [facility]   BREAST BIOPSY Left 02/21/2023   [place] LT BREAST BX W LOC DEV 1ST LESION IMG BX SPEC US GUIDE 02/21/2023 [facility]   BREAST BIOPSY Left 03/22/2023   [place] LT RADIOACTIVE SEED EA ADD LESION 03/22/2023 [facility]   BREAST BIOPSY  03/22/2023   [place] RT RADIOACTIVE SEED LOC 03/22/2023 [facility]    BREAST BIOPSY Right 03/22/2023   [place] RT RADIOACTIVE SEED EA ADD LESION 03/22/2023 [facility]   BREAST BIOPSY Left 03/22/2023   [place] LT RADIOACTIVE SEED LOC 03/22/2023 [facility]   BREAST LUMPECTOMY WITH RADIOACTIVE SEED LOCALIZATION Bilateral 03/26/2023   Procedure: RIGHT BREAST SEED BRACKETED LUMPECTOMY, LEFT BREAST SEED BRACKETED LUMPECTOMY;  Surgeon: [name], MD;  Location: [facility];  Service: General;  Laterality: Bilateral;   CATARACT EXTRACTION, BILATERAL     CHOLECYSTECTOMY       ALLERGIES:  Allergies  Allergen Reactions   Amoxicillin     Red rash with itching    Penicillins     Rash on stomach     CURRENT MEDICATIONS:  Outpatient Encounter Medications as of 08/22/2023  Medication Sig   amLODipine (NORVASC) 5 MG tablet Take 1 tablet (5 mg total) by mouth daily.   anastrozole (ARIMIDEX) 1 MG tablet Take 1 tablet (1 mg total) by mouth daily.   brimonidine-timolol (COMBIGAN) 0.2-0.5 % ophthalmic solution INSTILL 1 DROP INTO BOTH  EYES EVERY 12 HOURS   fexofenadine (ALLEGRA) 180 MG tablet Take 1 tablet (180 mg total) by mouth daily.   fluticasone (FLONASE) 50 MCG/ACT nasal spray Place 1 spray into both nostrils daily.   levothyroxine (SYNTHROID) 50 MCG tablet Take 1 tablet (50 mcg total) by mouth daily.   metoprolol succinate (TOPROL-XL) 25 MG 24 hr tablet Take 1 tablet (25 mg total) by mouth daily.   pantoprazole (PROTONIX) 40 MG tablet Take 1 tablet (40 mg total) by mouth daily.   rosuvastatin (CRESTOR) 20 MG tablet Take 1 tablet (20 mg total) by mouth daily.   No facility-administered encounter medications on file as of 08/22/2023.     ONCOLOGIC FAMILY HISTORY:  Family History  Problem Relation Age of Onset   Cancer Mother    Cancer Father        Brain    Heart disease Sister    Leukemia Sister    Cancer Brother        Prostate   Hyperlipidemia Daughter    Hypertension Daughter    Heart disease Son    Dementia Sister     Heart disease Son    Hyperlipidemia Son    Hypertension Son      SOCIAL HISTORY:  Social History   Socioeconomic History   Marital status: Widowed    Spouse name: Not on file   Number of children: 4   Years of education: Not on file   Highest education level: 8th grade  Occupational History   Occupation: retired    Comment: school bus driver   Tobacco Use   Smoking status: Former    Current packs/day: 0.00    Average packs/day: 0.3 packs/day for 20.0 years (5.0 ttl pk-yrs)    Types: Cigarettes    Start date: 10/29/1973    Quit date: 10/29/1993    Years since quitting: 29.8   Smokeless tobacco: Never  Vaping Use   Vaping status: Never Used  Substance and Sexual Activity   Alcohol use: Never  Drug use: Never   Sexual activity: Not on file  Other Topics Concern   Not on file  Social History Narrative   Not on file   Social Determinants of Health   Financial Resource Strain: Low Risk  (12/01/2018)   Overall Financial Resource Strain (CARDIA)    Difficulty of Paying Living Expenses: Not hard at all  Food Insecurity: No Food Insecurity (07/23/2023)   Hunger Vital Sign    Worried About Running Out of Food in the Last Year: Never true    Ran Out of Food in the Last Year: Never true  Transportation Needs: No Transportation Needs (07/23/2023)   PRAPARE - Administrator, Civil Service (Medical): No    Lack of Transportation (Non-Medical): No  Physical Activity: Inactive (12/01/2018)   Exercise Vital Sign    Days of Exercise per Week: 0 days    Minutes of Exercise per Session: 0 min  Stress: Not on file  Social Connections: Moderately Isolated (12/01/2018)   Social Connection and Isolation Panel [NHANES]    Frequency of Communication with Friends and Family: Once a week    Frequency of Social Gatherings with Friends and Family: More than three times a week    Attends Religious Services: Never    Database administrator or Organizations: No    Attends Tax inspector Meetings: Never    Marital Status: Widowed  Intimate Partner Violence: Not At Risk (07/23/2023)   Humiliation, Afraid, Rape, and Kick questionnaire    Fear of Current or Ex-Partner: No    Emotionally Abused: No    Physically Abused: No    Sexually Abused: No     OBSERVATIONS/OBJECTIVE:  BP (!) 164/55 (BP Location: Left Arm, Patient Position: Sitting)   Pulse 71   Temp 97.9 F (36.6 C) (Temporal)   Resp 18   Ht 4\' 11"  (1.499 m)   Wt 166 lb 6.4 oz (75.5 kg)   SpO2 98%   BMI 33.61 kg/m  GENERAL: Patient is a well appearing female in no acute distress HEENT:  Sclerae anicteric.  Oropharynx clear and moist. No ulcerations or evidence of oropharyngeal candidiasis. Neck is supple.  NODES:  No cervical, supraclavicular, or axillary lymphadenopathy palpated.  BREAST EXAM:  bilateral breast s/p lumpectomy and radiation, no sign of local recurrence LUNGS:  Clear to auscultation bilaterally.  No wheezes or rhonchi. HEART:  Regular rate and rhythm. No murmur appreciated. ABDOMEN:  Soft, nontender.  Positive, normoactive bowel sounds. No organomegaly palpated. MSK:  No focal spinal tenderness to palpation. Full range of motion bilaterally in the upper extremities. EXTREMITIES:  No peripheral edema.   SKIN:  Clear with no obvious rashes or skin changes. No nail dyscrasia. NEURO:  Nonfocal. Well oriented.  Appropriate affect.   LABORATORY DATA:  None for this visit.  DIAGNOSTIC IMAGING:  None for this visit.      ASSESSMENT AND PLAN:  Ms.. Parsons is a pleasant 89 y.o. female with a lateral breast cancer, ER+/PR+/HER2-, diagnosed in 12/2022, treated with lumpectomy, adjuvant radiation therapy, and anti-estrogen therapy with anastrozole beginning in 03/2023.  She presents to the Survivorship [facility] for our initial meeting and routine follow-up post-completion of treatment for breast cancer.    1. Bilateral breast cancer:  Carolyn Parsons is continuing to recover from definitive  treatment for breast cancer. She will follow-up with her medical oncologist, Dr. Pamelia Hoit in 6 months with history and physical exam per surveillance protocol.  She will continue her anti-estrogen therapy  with Anastrozole. Thus far, she is tolerating the Anastrozole well, with minimal side effects. Her mammogram is due 12/2023; orders placed today.   Today, a comprehensive survivorship care plan and treatment summary was reviewed with the patient today detailing her breast cancer diagnosis, treatment course, potential late/long-term effects of treatment, appropriate follow-up care with recommendations for the future, and patient education resources.  A copy of this summary, along with a letter will be sent to the patient's primary care provider via mail/fax/In Basket message after today's visit.    2. Bone health:  Given Carolyn Parsons's age/history of breast cancer and her current treatment regimen including anti-estrogen therapy with Anastrozole, she is at risk for bone demineralization.  Her most recent bone density was more than 10 years ago.  She will request repeat at her primary care office and have them fax [place] the results. she was given education on specific activities to promote bone health.  3. Cancer screening:  Due to Carolyn Parsons's history and her age, she should receive screening for skin cancers.  The information and recommendations are listed on the patient's comprehensive care plan/treatment summary and were reviewed in detail with the patient.    4. Health maintenance and wellness promotion: Carolyn Parsons was encouraged to consume 5-7 servings of fruits and vegetables per day. We reviewed the "Nutrition Rainbow" handout.  She was also encouraged to engage in moderate to vigorous exercise for 30 minutes per day most days of the week.  She was instructed to limit her alcohol consumption and continue to abstain from tobacco use.     5. Support services/counseling: It is not uncommon for this period of the  patient's cancer care trajectory to be one of many emotions and stressors.   She was given information regarding our available services and encouraged to contact me with any questions or for help enrolling in any of our support group/programs.    Follow up instructions:    -Return to cancer center in 6 months for follow-up with Dr. Pamelia Hoit -Mammogram due in 12/2023 -She is welcome to return back to the Survivorship Clinic at any time; no additional follow-up needed at this time.  -Consider referral back to survivorship as a long-term survivor for continued surveillance  The patient was provided an opportunity to ask questions and all were answered. The patient agreed with the plan and demonstrated an understanding of the instructions.   Total encounter time:30 minutes*in face-to-face visit time, chart review, lab review, care coordination, order entry, and documentation of the encounter time.    Lillard Anes, NP 08/22/23 11:04 AM Medical Oncology and Hematology [facility] [address] [place], [place] [address] Tel. [phone]    Fax. [phone]  *Total Encounter Time as defined by the Centers for Medicare and Medicaid Services includes, in addition to the face-to-face time of a patient visit (documented in the note above) non-face-to-face time: obtaining and reviewing outside history, ordering and reviewing medications, tests or procedures, care coordination (communications with other health care professionals or caregivers) and documentation in the medical record.

## 2023-08-27 ENCOUNTER — Ambulatory Visit: Payer: Medicare HMO | Admitting: Family

## 2023-08-27 ENCOUNTER — Encounter: Payer: Self-pay | Admitting: Family

## 2023-08-27 ENCOUNTER — Ambulatory Visit
Admission: RE | Admit: 2023-08-27 | Discharge: 2023-08-27 | Disposition: A | Discharge status: Home / Self Care | Payer: Medicare HMO | Source: Ambulatory Visit | Attending: Radiation Oncology

## 2023-08-27 ENCOUNTER — Other Ambulatory Visit: Payer: Self-pay

## 2023-08-27 VITALS — BP 170/67 | HR 59 | Temp 97.9°F | Ht 59.0 in | Wt 165.0 lb

## 2023-08-27 DIAGNOSIS — K219 Gastro-esophageal reflux disease without esophagitis: Secondary | ICD-10-CM

## 2023-08-27 DIAGNOSIS — E039 Hypothyroidism, unspecified: Secondary | ICD-10-CM

## 2023-08-27 DIAGNOSIS — I10 Essential (primary) hypertension: Secondary | ICD-10-CM

## 2023-08-27 DIAGNOSIS — E66811 Obesity, class 1: Secondary | ICD-10-CM | POA: Diagnosis not present

## 2023-08-27 DIAGNOSIS — Z510 Encounter for antineoplastic radiation therapy: Secondary | ICD-10-CM | POA: Diagnosis not present

## 2023-08-27 DIAGNOSIS — Z170 Estrogen receptor positive status [ER+]: Secondary | ICD-10-CM | POA: Diagnosis not present

## 2023-08-27 DIAGNOSIS — C50411 Malignant neoplasm of upper-outer quadrant of right female breast: Secondary | ICD-10-CM

## 2023-08-27 DIAGNOSIS — E785 Hyperlipidemia, unspecified: Secondary | ICD-10-CM

## 2023-08-27 DIAGNOSIS — I700 Atherosclerosis of aorta: Secondary | ICD-10-CM

## 2023-08-27 LAB — RAD ONC ARIA SESSION SUMMARY
Course Elapsed Days: 21
Plan Fractions Treated to Date: 4
Plan Fractions Treated to Date: 4
Plan Prescribed Dose Per Fraction: 5.7 Gy
Plan Prescribed Dose Per Fraction: 5.7 Gy
Plan Total Fractions Prescribed: 5
Plan Total Fractions Prescribed: 5
Plan Total Prescribed Dose: 28.5 Gy
Plan Total Prescribed Dose: 28.5 Gy
Reference Point Dosage Given to Date: 22.8 Gy
Reference Point Dosage Given to Date: 22.8 Gy
Reference Point Session Dosage Given: 5.7 Gy
Reference Point Session Dosage Given: 5.7 Gy
Session Number: 4

## 2023-08-27 MED ORDER — AMLODIPINE BESYLATE 10 MG PO TABS
10.0000 mg | ORAL_TABLET | Freq: Every day | ORAL | 2 refills | Status: DC
Start: 2023-08-27 — End: 2024-03-02

## 2023-08-27 NOTE — Patient Instructions (Signed)
Hypertension, Adult High blood pressure (hypertension) is when the force of blood pumping through the arteries is too strong. The arteries are the blood vessels that carry blood from the heart throughout the body. Hypertension forces the heart to work harder to pump blood and may cause arteries to become narrow or stiff. Untreated or uncontrolled hypertension can lead to a heart attack, heart failure, a stroke, kidney disease, and other problems. A blood pressure reading consists of a higher number over a lower number. Ideally, your blood pressure should be below 120/80. The first ("top") number is called the systolic pressure. It is a measure of the pressure in your arteries as your heart beats. The second ("bottom") number is called the diastolic pressure. It is a measure of the pressure in your arteries as the heart relaxes. What are the causes? The exact cause of this condition is not known. There are some conditions that result in high blood pressure. What increases the risk? Certain factors may make you more likely to develop high blood pressure. Some of these risk factors are under your control, including: Smoking. Not getting enough exercise or physical activity. Being overweight. Having too much fat, sugar, calories, or salt (sodium) in your diet. Drinking too much alcohol. Other risk factors include: Having a personal history of heart disease, diabetes, high cholesterol, or kidney disease. Stress. Having a family history of high blood pressure and high cholesterol. Having obstructive sleep apnea. Age. The risk increases with age. What are the signs or symptoms? High blood pressure may not cause symptoms. Very high blood pressure (hypertensive crisis) may cause: Headache. Fast or irregular heartbeats (palpitations). Shortness of breath. Nosebleed. Nausea and vomiting. Vision changes. Severe chest pain, dizziness, and seizures. How is this diagnosed? This condition is diagnosed by  measuring your blood pressure while you are seated, with your arm resting on a flat surface, your legs uncrossed, and your feet flat on the floor. The cuff of the blood pressure monitor will be placed directly against the skin of your upper arm at the level of your heart. Blood pressure should be measured at least twice using the same arm. Certain conditions can cause a difference in blood pressure between your right and left arms. If you have a high blood pressure reading during one visit or you have normal blood pressure with other risk factors, you may be asked to: Return on a different day to have your blood pressure checked again. Monitor your blood pressure at home for 1 week or longer. If you are diagnosed with hypertension, you may have other blood or imaging tests to help your health care provider understand your overall risk for other conditions. How is this treated? This condition is treated by making healthy lifestyle changes, such as eating healthy foods, exercising more, and reducing your alcohol intake. You may be referred for counseling on a healthy diet and physical activity. Your health care provider may prescribe medicine if lifestyle changes are not enough to get your blood pressure under control and if: Your systolic blood pressure is above 130. Your diastolic blood pressure is above 80. Your personal target blood pressure may vary depending on your medical conditions, your age, and other factors. Follow these instructions at home: Eating and drinking  Eat a diet that is high in fiber and potassium, and low in sodium, added sugar, and fat. An example of this eating plan is called the DASH diet. DASH stands for Dietary Approaches to Stop Hypertension. To eat this way: Eat   plenty of fresh fruits and vegetables. Try to fill one half of your plate at each meal with fruits and vegetables. Eat whole grains, such as whole-wheat pasta, brown rice, or whole-grain bread. Fill about one  fourth of your plate with whole grains. Eat or drink low-fat dairy products, such as skim milk or low-fat yogurt. Avoid fatty cuts of meat, processed or cured meats, and poultry with skin. Fill about one fourth of your plate with lean proteins, such as fish, chicken without skin, beans, eggs, or tofu. Avoid pre-made and processed foods. These tend to be higher in sodium, added sugar, and fat. Reduce your daily sodium intake. Many people with hypertension should eat less than 1,500 mg of sodium a day. Do not drink alcohol if: Your health care provider tells you not to drink. You are pregnant, may be pregnant, or are planning to become pregnant. If you drink alcohol: Limit how much you have to: 0-1 drink a day for women. 0-2 drinks a day for men. Know how much alcohol is in your drink. In the [place] one drink equals one 12 oz bottle of beer (355 mL), one 5 oz glass of wine (148 mL), or one 1 oz glass of hard liquor (44 mL). Lifestyle  Work with your health care provider to maintain a healthy body weight or to lose weight. Ask what an ideal weight is for you. Get at least 30 minutes of exercise that causes your heart to beat faster (aerobic exercise) most days of the week. Activities may include walking, swimming, or biking. Include exercise to strengthen your muscles (resistance exercise), such as Pilates or lifting weights, as part of your weekly exercise routine. Try to do these types of exercises for 30 minutes at least 3 days a week. Do not use any products that contain nicotine or tobacco. These products include cigarettes, chewing tobacco, and vaping devices, such as e-cigarettes. If you need help quitting, ask your health care provider. Monitor your blood pressure at home as told by your health care provider. Keep all follow-up visits. This is important. Medicines Take over-the-counter and prescription medicines only as told by your health care provider. Follow directions carefully. Blood  pressure medicines must be taken as prescribed. Do not skip doses of blood pressure medicine. Doing this puts you at risk for problems and can make the medicine less effective. Ask your health care provider about side effects or reactions to medicines that you should watch for. Contact a health care provider if you: Think you are having a reaction to a medicine you are taking. Have headaches that keep coming back (recurring). Feel dizzy. Have swelling in your ankles. Have trouble with your vision. Get help right away if you: Develop a severe headache or confusion. Have unusual weakness or numbness. Feel faint. Have severe pain in your chest or abdomen. Vomit repeatedly. Have trouble breathing. These symptoms may be an emergency. Get help right away. Call 911. Do not wait to see if the symptoms will go away. Do not drive yourself to the hospital. Summary Hypertension is when the force of blood pumping through your arteries is too strong. If this condition is not controlled, it may put you at risk for serious complications. Your personal target blood pressure may vary depending on your medical conditions, your age, and other factors. For most people, a normal blood pressure is less than 120/80. Hypertension is treated with lifestyle changes, medicines, or a combination of both. Lifestyle changes include losing weight, eating a healthy,   low-sodium diet, exercising more, and limiting alcohol. This information is not intended to replace advice given to you by your health care provider. Make sure you discuss any questions you have with your health care provider. Document Revised: 08/22/2021 Document Reviewed: 08/22/2021 Elsevier Patient Education  2024 [employer]  

## 2023-08-27 NOTE — Progress Notes (Signed)
Subjective:    Patient ID: Carolyn Parsons, female    DOB: [birth year], 89 y.o.   MRN: [id number]  Chief Complaint  Patient presents with   Medical Management of Chronic Issues    COUGH ALL THE TIME X3 WEEKS    Pt presents to the office today for  chronic follow up. She has chronic back pain,  has seen Pain management for injections that has helped in the past.    She aortic atherosclerosis and takes Crestor 20 mg daily.    She is followed by Oncologists for bilateral breast cancer. She is currently doing Radiation weekly.  Hypertension This is a chronic problem. The current episode started more than 1 year ago. The problem is unchanged. The problem is uncontrolled. Associated symptoms include malaise/fatigue and shortness of breath (when walking). Pertinent negatives include no peripheral edema. Risk factors for coronary artery disease include obesity and sedentary lifestyle. The current treatment provides moderate improvement. Identifiable causes of hypertension include a thyroid problem.  Gastroesophageal Reflux She complains of belching, coughing and heartburn. This is a chronic problem. The current episode started more than 1 year ago. The problem occurs occasionally. Associated symptoms include fatigue. She has tried a PPI for the symptoms. The treatment provided moderate relief.  Thyroid Problem Presents for follow-up visit. Symptoms include anxiety and fatigue. The symptoms have been stable. Her past medical history is significant for hyperlipidemia.  Hyperlipidemia This is a chronic problem. The current episode started more than 1 year ago. The problem is controlled. Recent lipid tests were reviewed and are normal. Exacerbating diseases include obesity. Associated symptoms include shortness of breath (when walking). Current antihyperlipidemic treatment includes statins. The current treatment provides moderate improvement of lipids. Risk factors for coronary artery disease include  dyslipidemia, hypertension and a sedentary lifestyle.  Cough This is a chronic problem. The current episode started more than 1 month ago. Associated symptoms include heartburn and shortness of breath (when walking).      Review of Systems  Constitutional:  Positive for fatigue and malaise/fatigue.  Respiratory:  Positive for cough and shortness of breath (when walking).   Gastrointestinal:  Positive for heartburn.  Psychiatric/Behavioral:  The patient is nervous/anxious.   All other systems reviewed and are negative.      Objective:   Physical Exam Vitals reviewed.  Constitutional:      General: She is not in acute distress.    Appearance: She is well-developed. She is obese.  HENT:     Head: Normocephalic and atraumatic.     Right Ear: Tympanic membrane normal.     Left Ear: Tympanic membrane normal.  Eyes:     Pupils: Pupils are equal, round, and reactive to light.  Neck:     Thyroid: No thyromegaly.  Cardiovascular:     Rate and Rhythm: Normal rate and regular rhythm.     Heart sounds: Normal heart sounds. No murmur heard. Pulmonary:     Effort: Pulmonary effort is normal. No respiratory distress.     Breath sounds: Normal breath sounds. No wheezing.  Abdominal:     General: Bowel sounds are normal. There is no distension.     Palpations: Abdomen is soft.     Tenderness: There is no abdominal tenderness.  Musculoskeletal:        General: No tenderness. Normal range of motion.     Cervical back: Normal range of motion and neck supple.  Skin:    General: Skin is warm and dry.  Neurological:  Mental Status: She is alert and oriented to person, place, and time.     Cranial Nerves: No cranial nerve deficit.     Deep Tendon Reflexes: Reflexes are normal and symmetric.  Psychiatric:        Behavior: Behavior normal.        Thought Content: Thought content normal.        Judgment: Judgment normal.      BP (!) 170/67   Pulse (!) 59   Temp 97.9 F (36.6 C)  (Temporal)   Ht 4\' 11"  (1.499 m)   Wt 165 lb (74.8 kg)   SpO2 96%   BMI 33.33 kg/m       Assessment & Plan:  Carolyn Parsons comes in today with chief complaint of Medical Management of Chronic Issues (COUGH ALL THE TIME X3 WEEKS )   Diagnosis and orders addressed:  1. Malignant neoplasm of upper-outer quadrant of right breast in female, estrogen receptor positive (HCC) - CMP14+EGFR - CBC with Differential/Platelet  2. Obesity (BMI 30.0-34.9) - CMP14+EGFR - CBC with Differential/Platelet  3. Hypothyroidism, unspecified type - CMP14+EGFR - CBC with Differential/Platelet  4. Primary hypertension Will increase Norvasc 10 mg from 5 mg  Discussed risk of peripheral edema  -Dash diet information given -Exercise encouraged - Stress Management  -Continue current meds -RTO in 1 month - amLODipine (NORVASC) 10 MG tablet; Take 1 tablet (10 mg total) by mouth daily.  Dispense: 90 tablet; Refill: 2 - CMP14+EGFR - CBC with Differential/Platelet  5. Aortic atherosclerosis (HCC) - CMP14+EGFR - CBC with Differential/Platelet  6. Gastroesophageal reflux disease, unspecified whether esophagitis present - CMP14+EGFR - CBC with Differential/Platelet  7. Hyperlipidemia, unspecified hyperlipidemia type - CMP14+EGFR - CBC with Differential/Platelet   Labs pending Health Maintenance reviewed Diet and exercise encouraged  Follow up plan: 1 month   Jannifer Rodney, FNP

## 2023-08-29 ENCOUNTER — Ambulatory Visit: Payer: Medicare HMO | Admitting: Family

## 2023-09-03 ENCOUNTER — Other Ambulatory Visit: Payer: Self-pay

## 2023-09-03 ENCOUNTER — Ambulatory Visit
Admission: RE | Admit: 2023-09-03 | Discharge: 2023-09-03 | Disposition: A | Discharge status: Home / Self Care | Payer: Medicare HMO | Source: Ambulatory Visit | Attending: Radiation Oncology | Admitting: Radiation Oncology

## 2023-09-03 ENCOUNTER — Ambulatory Visit: Payer: Medicare HMO

## 2023-09-03 DIAGNOSIS — C50812 Malignant neoplasm of overlapping sites of left female breast: Secondary | ICD-10-CM | POA: Diagnosis present

## 2023-09-03 DIAGNOSIS — Z510 Encounter for antineoplastic radiation therapy: Secondary | ICD-10-CM | POA: Diagnosis present

## 2023-09-03 LAB — RAD ONC ARIA SESSION SUMMARY
Course Elapsed Days: 28
Plan Fractions Treated to Date: 5
Plan Fractions Treated to Date: 5
Plan Prescribed Dose Per Fraction: 5.7 Gy
Plan Prescribed Dose Per Fraction: 5.7 Gy
Plan Total Fractions Prescribed: 5
Plan Total Fractions Prescribed: 5
Plan Total Prescribed Dose: 28.5 Gy
Plan Total Prescribed Dose: 28.5 Gy
Reference Point Dosage Given to Date: 28.5 Gy
Reference Point Dosage Given to Date: 28.5 Gy
Reference Point Session Dosage Given: 5.7 Gy
Reference Point Session Dosage Given: 5.7 Gy
Session Number: 5

## 2023-09-04 NOTE — Radiation Completion Notes (Signed)
Patient Name: Carolyn Parsons, Carolyn Parsons MRN: [id number] Date of Birth: 01-12-1932 Referring Physician: Jannifer Rodney, M.D. Date of Service: 2023-09-04 Radiation Oncologist: Lonie Peak, M.D. [facility] [place]                             RADIATION ONCOLOGY END OF TREATMENT NOTE     Diagnosis: C50.411 Malignant neoplasm of upper-outer quadrant of right female breast; C50.812 Malignant neoplasm of overlapping sites of left female breast Staging on 2023-03-26: Malignant neoplasm of upper-outer quadrant of right breast in female, estrogen receptor positive (HCC) T=pT1c, N=pN0, M=cM0 Staging on 2023-03-26: Malignant neoplasm of overlapping sites of left breast in female, estrogen receptor positive (HCC) T=pT2, N=pN0, M=cM0 Intent: Curative     ==========DELIVERED PLANS==========  First Treatment Date: 2023-08-06 - Last Treatment Date: 2023-09-03   Plan Name: Breast_R Site: Breast, Right Technique: 3D Mode: Photon Dose Per Fraction: 5.7 Gy Prescribed Dose (Delivered / Prescribed): 28.5 Gy / 28.5 Gy Prescribed Fxs (Delivered / Prescribed): 5 / 5   Plan Name: Breast_L Site: Breast, Left Technique: 3D Mode: Photon Dose Per Fraction: 5.7 Gy Prescribed Dose (Delivered / Prescribed): 28.5 Gy / 28.5 Gy Prescribed Fxs (Delivered / Prescribed): 5 / 5     ==========ON TREATMENT VISIT DATES========== 2023-08-20     ==========UPCOMING VISITS==========       ==========APPENDIX - ON TREATMENT VISIT NOTES==========   See weekly On Treatment Notes in Epic for details.

## 2023-09-23 ENCOUNTER — Ambulatory Visit (INDEPENDENT_AMBULATORY_CARE_PROVIDER_SITE_OTHER): Payer: Medicare HMO

## 2023-09-23 ENCOUNTER — Ambulatory Visit: Payer: Medicare HMO | Admitting: Family

## 2023-09-23 VITALS — BP 162/73 | HR 64

## 2023-09-23 DIAGNOSIS — Z23 Encounter for immunization: Secondary | ICD-10-CM | POA: Diagnosis not present

## 2023-09-23 DIAGNOSIS — Z0130 Encounter for examination of blood pressure without abnormal findings: Secondary | ICD-10-CM

## 2023-09-23 NOTE — Progress Notes (Signed)
Patient here today for blood pressure check and flu shot.  Blood pressure is 162/73, pulse 64.  Please advise if you would like to make any changes in medication.

## 2023-09-24 ENCOUNTER — Other Ambulatory Visit: Payer: Self-pay

## 2023-09-24 DIAGNOSIS — I10 Essential (primary) hypertension: Secondary | ICD-10-CM

## 2023-09-24 MED ORDER — LISINOPRIL 10 MG PO TABS: 10.0000 mg | ORAL_TABLET | Freq: Every day | ORAL | 0 refills | Status: DC

## 2023-10-03 ENCOUNTER — Ambulatory Visit
Admission: RE | Admit: 2023-10-03 | Discharge: 2023-10-03 | Disposition: A | Discharge status: Home / Self Care | Payer: Medicare HMO | Source: Ambulatory Visit | Attending: Radiation Oncology | Admitting: Radiation Oncology

## 2023-10-03 ENCOUNTER — Encounter: Payer: Self-pay | Admitting: Radiation Oncology

## 2023-10-03 NOTE — Progress Notes (Signed)
Patient identity verified x2  Patient is here today for follow up post radiation to the breast.  Diagnosis: C50.411 Malignant neoplasm of upper-outer quadrant of right female breast; C50.812 Malignant neoplasm of overlapping sites of left female breast   Breast Side: LT & RT  They completed their radiation on: 09/03/2023   Does the patient complain of any of the following: Post radiation skin issues: None Breast Tenderness: None Breast Swelling: None Lymphadema: None Range of Motion limitations: None Fatigue post radiation: Mild Appetite good/fair/poor: Good Hormone Therapy Start: 04/03/2023 Anastrozole  Additional comments if applicable:   This concludes the interaction.  Ruel Favors, LPN

## 2023-10-17 ENCOUNTER — Telehealth: Payer: Self-pay

## 2023-10-17 NOTE — Telephone Encounter (Signed)
Called and spoke with daughter she left some readings with all patient being on all 3 bp meds.  09/29/23 09/30/23 10/01/23 10/02/23 10/03/23 10/04/23  AM 140/72 149/69  120/65  111/59  127/61  139/69       PM 142/72 163/70  151/71  Did not get on  144/66  143/70       This PM

## 2023-10-17 NOTE — Telephone Encounter (Signed)
Called daughter out of town till [place] will come in for nurse visit bp and make follow up with Neysa Bonito that day

## 2023-10-17 NOTE — Telephone Encounter (Signed)
PT's BP is stable, does look like it is more elevated in the evening. However, given her age I am ok with it slightly elevated.   However, I do recommend a face to face visit to recheck BP in office.

## 2023-10-17 NOTE — Telephone Encounter (Signed)
Copied from CRM [id number]. Topic: Clinical - Medical Advice >> Oct 16, 2023  4:50 PM Fuller Mandril wrote: Reason for CRM: Pt daughter called. Would like to discuss pt bp updates with Nurse Morrie Sheldon. Please give her a callback when you can at [phone]

## 2023-10-21 ENCOUNTER — Telehealth: Payer: Self-pay

## 2023-10-21 NOTE — Telephone Encounter (Signed)
Copied from CRM [id number]. Topic: Clinical - Medical Advice >> Oct 21, 2023  1:13 PM Victorino Dike T wrote: Reason for CRM: Patient having pain and numbness in both hands making it hard to sleep at night  Please call daughter Richarda Osmond [phone]

## 2023-10-21 NOTE — Telephone Encounter (Signed)
Appt scheduled for 10/25/23 9:20 with DOD

## 2023-10-25 ENCOUNTER — Other Ambulatory Visit: Payer: Medicare HMO

## 2023-10-25 ENCOUNTER — Encounter: Payer: Self-pay | Admitting: Family Medicine

## 2023-10-25 ENCOUNTER — Ambulatory Visit (INDEPENDENT_AMBULATORY_CARE_PROVIDER_SITE_OTHER): Payer: Medicare HMO | Admitting: Family Medicine

## 2023-10-25 ENCOUNTER — Ambulatory Visit: Payer: Medicare HMO

## 2023-10-25 VITALS — BP 145/87 | HR 55 | Temp 98.2°F | Ht 59.0 in | Wt 167.0 lb

## 2023-10-25 DIAGNOSIS — R200 Anesthesia of skin: Secondary | ICD-10-CM | POA: Diagnosis not present

## 2023-10-25 DIAGNOSIS — I10 Essential (primary) hypertension: Secondary | ICD-10-CM

## 2023-10-25 NOTE — Progress Notes (Signed)
Subjective:  Patient ID: Carolyn Parsons, female    DOB: 04-03-1932, 89 y.o.   MRN: [id number]  Patient Care Team: [name], FNP as PCP - General (Family Medicine) Serena Croissant, MD as Consulting Physician (Hematology and Oncology) [name], MD as Consulting Physician (General Surgery) Lonie Peak, MD as Attending Physician (Radiation Oncology)   Chief Complaint:  bilateral hand pain/numbness  HPI: Carolyn Parsons is a 89 y.o. female presenting on 10/25/2023 for bilateral hand pain/numbness States that when she is in bed at night sleeping her hand will go numb. She is worried that it is due to her new BP medication. States that she holds a tissue in her hand. States that it comes and goes. States that it does "hurt" but it is not pins and needles, states that it is keeps her awake a night. States that it comes off and on through the day. Sleeps on her side. Sleeps with hands folded. Started about 2 weeks ago, which was 2 weeks after she started lisinopril. States that about one year ago she injured her left wrist while using a puzzle book. She went to ortho at that time and received imaging. She was told she had some arthritis and she wore a brace for a time. States that she rested and iced it, and it improved. States that it was reinjured a while ago at that nail salon. She does not like taking medications such as tylenol and ibuprofen.   Relevant past medical, surgical, family, and social history reviewed and updated as indicated.  Allergies and medications reviewed and updated. Data reviewed: Chart in Epic.  Past Medical History:  Diagnosis Date   Cataract    GERD (gastroesophageal reflux disease)    Hyperlipidemia    Hypertension     Past Surgical History:  Procedure Laterality Date   APPENDECTOMY     BLADDER REPAIR     BREAST BIOPSY Left 01/25/2023   [place] LT BREAST BX W LOC DEV EA ADD LESION IMG BX SPEC US GUIDE 01/25/2023 [facility]   BREAST BIOPSY Left  01/25/2023   [place] LT BREAST BX W LOC DEV 1ST LESION IMG BX SPEC US GUIDE 01/25/2023 [facility]   BREAST BIOPSY Left 01/25/2023   [place] LT BREAST BX W LOC DEV EA ADD LESION IMG BX SPEC US GUIDE 01/25/2023 [facility]   BREAST BIOPSY Right 01/25/2023   [place] RT BREAST BX W LOC DEV 1ST LESION IMG BX SPEC US GUIDE 01/25/2023 [facility]   BREAST BIOPSY Left 02/21/2023   [place] LT BREAST BX W LOC DEV 1ST LESION IMG BX SPEC US GUIDE 02/21/2023 [facility]   BREAST BIOPSY Left 03/22/2023   [place] LT RADIOACTIVE SEED EA ADD LESION 03/22/2023 [facility]   BREAST BIOPSY  03/22/2023   [place] RT RADIOACTIVE SEED LOC 03/22/2023 [facility]   BREAST BIOPSY Right 03/22/2023   [place] RT RADIOACTIVE SEED EA ADD LESION 03/22/2023 [facility]   BREAST BIOPSY Left 03/22/2023   [place] LT RADIOACTIVE SEED LOC 03/22/2023 [facility]   BREAST LUMPECTOMY WITH RADIOACTIVE SEED LOCALIZATION Bilateral 03/26/2023   Procedure: RIGHT BREAST SEED BRACKETED LUMPECTOMY, LEFT BREAST SEED BRACKETED LUMPECTOMY;  Surgeon: [name], MD;  Location: [facility];  Service: General;  Laterality: Bilateral;   CATARACT EXTRACTION, BILATERAL     CHOLECYSTECTOMY      Social History   Socioeconomic History   Marital status: Widowed    Spouse name: Not on file   Number of  children: 4   Years of education: Not on file   Highest education level: 8th grade  Occupational History   Occupation: retired    Comment: school bus driver   Tobacco Use   Smoking status: Former    Current packs/day: 0.00    Average packs/day: 0.3 packs/day for 20.0 years (5.0 ttl pk-yrs)    Types: Cigarettes    Start date: 10/29/1973    Quit date: 10/29/1993    Years since quitting: 30.0   Smokeless tobacco: Never  Vaping Use   Vaping status: Never Used  Substance and Sexual Activity   Alcohol use: Never   Drug use: Never   Sexual activity: Not on file  Other Topics Concern   Not on file  Social History Narrative    Not on file   Social Drivers of Health   Financial Resource Strain: Low Risk  (12/01/2018)   Overall Financial Resource Strain (CARDIA)    Difficulty of Paying Living Expenses: Not hard at all  Food Insecurity: No Food Insecurity (07/23/2023)   Hunger Vital Sign    Worried About Running Out of Food in the Last Year: Never true    Ran Out of Food in the Last Year: Never true  Transportation Needs: No Transportation Needs (07/23/2023)   PRAPARE - Administrator, Civil Service (Medical): No    Lack of Transportation (Non-Medical): No  Physical Activity: Inactive (12/01/2018)   Exercise Vital Sign    Days of Exercise per Week: 0 days    Minutes of Exercise per Session: 0 min  Stress: Not on file  Social Connections: Moderately Isolated (12/01/2018)   Social Connection and Isolation Panel [NHANES]    Frequency of Communication with Friends and Family: Once a week    Frequency of Social Gatherings with Friends and Family: More than three times a week    Attends Religious Services: Never    Database administrator or Organizations: No    Attends Banker Meetings: Never    Marital Status: Widowed  Intimate Partner Violence: Not At Risk (07/23/2023)   Humiliation, Afraid, Rape, and Kick questionnaire    Fear of Current or Ex-Partner: No    Emotionally Abused: No    Physically Abused: No    Sexually Abused: No    Outpatient Encounter Medications as of 10/25/2023  Medication Sig   amLODipine (NORVASC) 10 MG tablet Take 1 tablet (10 mg total) by mouth daily.   anastrozole (ARIMIDEX) 1 MG tablet Take 1 tablet (1 mg total) by mouth daily.   brimonidine-timolol (COMBIGAN) 0.2-0.5 % ophthalmic solution INSTILL 1 DROP INTO BOTH  EYES EVERY 12 HOURS   fexofenadine (ALLEGRA) 180 MG tablet Take 1 tablet (180 mg total) by mouth daily.   fluticasone (FLONASE) 50 MCG/ACT nasal spray Place 1 spray into both nostrils daily.   levothyroxine (SYNTHROID) 50 MCG tablet Take 1 tablet  (50 mcg total) by mouth daily.   lisinopril (ZESTRIL) 10 MG tablet Take 1 tablet (10 mg total) by mouth daily.   metoprolol succinate (TOPROL-XL) 25 MG 24 hr tablet Take 1 tablet (25 mg total) by mouth daily.   pantoprazole (PROTONIX) 40 MG tablet Take 1 tablet (40 mg total) by mouth daily.   rosuvastatin (CRESTOR) 20 MG tablet Take 1 tablet (20 mg total) by mouth daily.   No facility-administered encounter medications on file as of 10/25/2023.    Allergies  Allergen Reactions   Amoxicillin     Red rash with itching  Penicillins     Rash on stomach    Review of Systems As per HPI  Objective:  BP (!) 145/87 (BP Location: Left Arm, Patient Position: Sitting, Cuff Size: Large)   Pulse (!) 55   Temp 98.2 F (36.8 C)   Ht 4\' 11"  (1.499 m)   Wt 167 lb (75.8 kg)   SpO2 96%   BMI 33.73 kg/m    Wt Readings from Last 3 Encounters:  10/25/23 167 lb (75.8 kg)  10/03/23 170 lb (77.1 kg)  08/27/23 165 lb (74.8 kg)    Physical Exam Constitutional:      General: She is awake. She is not in acute distress.    Appearance: Normal appearance. She is well-developed and well-groomed. She is not ill-appearing, toxic-appearing or diaphoretic.  Cardiovascular:     Rate and Rhythm: Normal rate and regular rhythm.     Pulses: Normal pulses.          Radial pulses are 2+ on the right side and 2+ on the left side.       Posterior tibial pulses are 2+ on the right side and 2+ on the left side.     Heart sounds: Normal heart sounds. No murmur heard.    No gallop.  Pulmonary:     Effort: Pulmonary effort is normal. No respiratory distress.     Breath sounds: Normal breath sounds. No stridor. No wheezing, rhonchi or rales.  Musculoskeletal:     Cervical back: Full passive range of motion without pain and neck supple.     Right lower leg: No edema.     Left lower leg: No edema.     Comments: Negative Tinel bilateral  Negative Phalen bilateral   Skin:    General: Skin is warm.     Capillary  Refill: Capillary refill takes less than 2 seconds.  Neurological:     General: No focal deficit present.     Mental Status: She is alert, oriented to person, place, and time and easily aroused. Mental status is at baseline.     GCS: GCS eye subscore is 4. GCS verbal subscore is 5. GCS motor subscore is 6.     Motor: No weakness.  Psychiatric:        Attention and Perception: Attention and perception normal.        Mood and Affect: Mood and affect normal.        Speech: Speech normal.        Behavior: Behavior normal. Behavior is cooperative.        Thought Content: Thought content normal. Thought content does not include homicidal or suicidal ideation. Thought content does not include homicidal or suicidal plan.        Cognition and Memory: Cognition and memory normal.        Judgment: Judgment normal.     Results for orders placed or performed in visit on 09/03/23  Rad Onc [facility] Session Summary   Collection Time: 09/03/23 10:34 AM  Result Value Ref Range   Course ID [id number]    Course Start Date 07/23/2023    Session Number 5    Course First Treatment Date 08/06/2023 12:23 PM    Course Last Treatment Date 09/03/2023 10:32 AM    Course Elapsed Days 28    Reference Point ID Breast_L_dp    Reference Point Dosage Given to Date 28.5 Gy   Reference Point Session Dosage Given 5.7 Gy   Reference Point ID Breast_R_dp    Reference  Point Dosage Given to Date 28.5 Gy   Reference Point Session Dosage Given 5.7 Gy   Plan ID Breast_L    Plan Name Breast_L    Plan Fractions Treated to Date 5    Plan Total Fractions Prescribed 5    Plan Prescribed Dose Per Fraction 5.7 Gy   Plan Total Prescribed Dose 28.500000 Gy   Plan Primary Reference Point Breast_L_dp    Plan ID Breast_R    Plan Name Breast_R    Plan Fractions Treated to Date 5    Plan Total Fractions Prescribed 5    Plan Prescribed Dose Per Fraction 5.7 Gy   Plan Total Prescribed Dose 28.500000 Gy   Plan Primary Reference Point  Breast_R_dp        10/25/2023    9:17 AM 08/27/2023    9:00 AM 03/29/2023    8:30 AM 07/17/2022    8:18 AM 05/19/2021    9:59 AM  Depression screen PHQ 2/9  Decreased Interest 0 0 0 0 0  Down, Depressed, Hopeless 0 0 0 0 0  PHQ - 2 Score 0 0 0 0 0  Altered sleeping  0 0 0   Tired, decreased energy  0 0 0   Change in appetite  0 0 0   Feeling bad or failure about yourself   0 0 0   Trouble concentrating  0 0 0   Moving slowly or fidgety/restless  0 0 0   Suicidal thoughts  0 0 0   PHQ-9 Score  0 0 0   Difficult doing work/chores  Not difficult at all Not difficult at all Not difficult at all        08/27/2023    9:00 AM 03/29/2023    8:31 AM 11/27/2019    8:25 AM  GAD 7 : Generalized Anxiety Score  Nervous, Anxious, on Edge 0 0 0  Control/stop worrying 0 0 0  Worry too much - different things 0 0 0  Trouble relaxing 0 0 0  Restless 0 0 0  Easily annoyed or irritable 0 0 0  Afraid - awful might happen 0 0 0  Total GAD 7 Score 0 0 0  Anxiety Difficulty Not difficult at all Not difficult at all Not difficult at all    Pertinent labs & imaging results that were available during my care of the patient were reviewed by me and considered in my medical decision making.  Assessment & Plan:  Viya was seen today for bilateral hand pain/numbness.  Diagnoses and all orders for this visit:  Bilateral hand numbness Discussed various etiologies with patient. Discussed that likely not due to carpal tunnel as patient is negative to Phalen and Tinel tests. Discussed with patient that likely due to positioning of arms as she is in chair or bed. Encouraged her to increase movement and change of position. Patient to consider change in pillow. Would like to consider vitamin deficiency. Patient already had labs drawn today for previously ordered labs, will not order additional labs in office today.   Continue all other maintenance medications.  Follow up plan: Return if symptoms worsen or  fail to improve.   Continue healthy lifestyle choices, including diet (rich in fruits, vegetables, and lean proteins, and low in salt and simple carbohydrates) and exercise (at least 30 minutes of moderate physical activity daily).  Written and verbal instructions provided   The above assessment and management plan was discussed with the patient. The patient verbalized understanding of and has  agreed to the management plan. Patient is aware to call the clinic if they develop any new symptoms or if symptoms persist or worsen. Patient is aware when to return to the clinic for a follow-up visit. Patient educated on when it is appropriate to go to the emergency department.   Neale Burly, DNP-FNP [facility] [address] [place], [place] [address] [phone]

## 2023-10-26 LAB — BMP8+EGFR
BUN/Creatinine Ratio: 20 (ref 12–28)
BUN: 19 mg/dL (ref 8–27)
CO2: 23 mmol/L (ref 20–29)
Calcium: 9.4 mg/dL (ref 8.7–10.3)
Chloride: 105 mmol/L (ref 96–106)
Creatinine, Ser: 0.93 mg/dL (ref 0.57–1.00)
Glucose: 114 mg/dL — ABNORMAL HIGH (ref 70–99)
Potassium: 4.5 mmol/L (ref 3.5–5.2)
Sodium: 142 mmol/L (ref 134–144)
eGFR: 59 mL/min/{1.73_m2} — ABNORMAL LOW (ref >59)

## 2023-11-16 ENCOUNTER — Other Ambulatory Visit: Payer: Self-pay | Admitting: Family Medicine

## 2023-12-06 ENCOUNTER — Other Ambulatory Visit: Payer: Self-pay | Admitting: Family

## 2023-12-06 ENCOUNTER — Ambulatory Visit (INDEPENDENT_AMBULATORY_CARE_PROVIDER_SITE_OTHER): Payer: Medicare HMO

## 2023-12-06 DIAGNOSIS — Z780 Asymptomatic menopausal state: Secondary | ICD-10-CM

## 2023-12-06 DIAGNOSIS — M8580 Other specified disorders of bone density and structure, unspecified site: Secondary | ICD-10-CM | POA: Insufficient documentation

## 2023-12-11 ENCOUNTER — Other Ambulatory Visit: Payer: Self-pay | Admitting: Hematology and Oncology

## 2023-12-11 ENCOUNTER — Other Ambulatory Visit: Payer: Self-pay | Admitting: Family Medicine

## 2023-12-11 ENCOUNTER — Other Ambulatory Visit: Payer: Self-pay | Admitting: Family

## 2023-12-11 DIAGNOSIS — I10 Essential (primary) hypertension: Secondary | ICD-10-CM

## 2023-12-11 DIAGNOSIS — E785 Hyperlipidemia, unspecified: Secondary | ICD-10-CM

## 2023-12-11 DIAGNOSIS — K219 Gastro-esophageal reflux disease without esophagitis: Secondary | ICD-10-CM

## 2024-02-06 ENCOUNTER — Ambulatory Visit
Admission: RE | Admit: 2024-02-06 | Discharge: 2024-02-06 | Disposition: A | Discharge status: Home / Self Care | Payer: Medicare HMO | Source: Ambulatory Visit | Attending: Adult Health | Admitting: Adult Health

## 2024-02-06 DIAGNOSIS — Z170 Estrogen receptor positive status [ER+]: Secondary | ICD-10-CM

## 2024-02-06 DIAGNOSIS — C50812 Malignant neoplasm of overlapping sites of left female breast: Secondary | ICD-10-CM

## 2024-02-14 ENCOUNTER — Other Ambulatory Visit: Payer: Self-pay | Admitting: Family

## 2024-02-14 DIAGNOSIS — E785 Hyperlipidemia, unspecified: Secondary | ICD-10-CM

## 2024-02-14 DIAGNOSIS — I10 Essential (primary) hypertension: Secondary | ICD-10-CM

## 2024-02-14 DIAGNOSIS — K219 Gastro-esophageal reflux disease without esophagitis: Secondary | ICD-10-CM

## 2024-02-16 ENCOUNTER — Other Ambulatory Visit: Payer: Self-pay | Admitting: Family

## 2024-02-17 ENCOUNTER — Encounter: Payer: Self-pay | Admitting: Family

## 2024-02-17 ENCOUNTER — Ambulatory Visit (INDEPENDENT_AMBULATORY_CARE_PROVIDER_SITE_OTHER): Payer: Medicare HMO | Admitting: Family

## 2024-02-17 VITALS — BP 152/69 | HR 52 | Temp 97.1°F | Ht 59.0 in | Wt 163.2 lb

## 2024-02-17 DIAGNOSIS — K219 Gastro-esophageal reflux disease without esophagitis: Secondary | ICD-10-CM | POA: Diagnosis not present

## 2024-02-17 DIAGNOSIS — E039 Hypothyroidism, unspecified: Secondary | ICD-10-CM

## 2024-02-17 DIAGNOSIS — I700 Atherosclerosis of aorta: Secondary | ICD-10-CM

## 2024-02-17 DIAGNOSIS — M8580 Other specified disorders of bone density and structure, unspecified site: Secondary | ICD-10-CM

## 2024-02-17 DIAGNOSIS — Z170 Estrogen receptor positive status [ER+]: Secondary | ICD-10-CM

## 2024-02-17 DIAGNOSIS — C50411 Malignant neoplasm of upper-outer quadrant of right female breast: Secondary | ICD-10-CM

## 2024-02-17 DIAGNOSIS — Z0000 Encounter for general adult medical examination without abnormal findings: Secondary | ICD-10-CM

## 2024-02-17 DIAGNOSIS — Z0001 Encounter for general adult medical examination with abnormal findings: Secondary | ICD-10-CM | POA: Diagnosis not present

## 2024-02-17 DIAGNOSIS — E785 Hyperlipidemia, unspecified: Secondary | ICD-10-CM | POA: Diagnosis not present

## 2024-02-17 DIAGNOSIS — I10 Essential (primary) hypertension: Secondary | ICD-10-CM | POA: Diagnosis not present

## 2024-02-17 DIAGNOSIS — E66811 Obesity, class 1: Secondary | ICD-10-CM

## 2024-02-17 LAB — LIPID PANEL

## 2024-02-17 MED ORDER — PANTOPRAZOLE SODIUM 20 MG PO TBEC: 20.0000 mg | DELAYED_RELEASE_TABLET | Freq: Every day | ORAL | 2 refills | Status: DC

## 2024-02-17 MED ORDER — LISINOPRIL 20 MG PO TABS: 20.0000 mg | ORAL_TABLET | Freq: Every day | ORAL | 3 refills | Status: DC

## 2024-02-17 NOTE — Patient Instructions (Signed)
 GERD in Adults: What to Know  Gastroesophageal reflux (GER) is when acid from your stomach flows up into your esophagus. Your esophagus is the part of your body that moves food from your mouth to your stomach. Normally, food goes down and stays in your stomach to be digested. But with GER, food and stomach acid may go back up. You may have a disease called gastroesophageal reflux disease (GERD) if the reflux: Happens often. Causes very bad symptoms. Makes your esophagus sore and swollen. Over time, GERD can make small holes called ulcers in the lining of your esophagus. What are the causes? GERD is caused by a problem with the muscle between your esophagus and stomach. This muscle is called the lower esophageal sphincter (LES). When it's weak or not normal, it doesn't close like it should. This means food and stomach acid can go back up into your esophagus. The muscle can be weak if: You smoke or use products with tobacco in them. You're pregnant. You have a type of hernia called a hiatal hernia. You eat certain foods and drinks. These include: Alcohol. Coffee. Chocolate. Onions. Peppermint. What increases the risk? Being overweight. Having a disease that affects your connective tissue. Taking NSAIDs, such as ibuprofen. What are the signs or symptoms? Heartburn. Trouble swallowing. Pain when you swallow. The feeling of having a lump in your throat. A bitter taste in your mouth. Bad breath. Having an upset or bloated stomach. Burping. Chest pain. Other conditions can also cause chest pain. Make sure you see your health care provider if you have chest pain. Wheezing. This is when you make high-pitched whistling sounds when you breathe, most often when you breathe out. A long-term cough or a cough at night. How is this diagnosed? GERD may be diagnosed based on your medical history and a physical exam. You may also have tests. These may include: An endoscopy. This test looks at your  stomach and esophagus with a small camera. A barium swallow test. This shows the shape and size of your esophagus and how well it's working. Tests of your esophagus to check for: Acid levels. Pressure. How is this treated? Treatment may depend on how bad your symptoms are. It may include: Changes to your diet and daily life. Medicines. Surgery. Follow these instructions at home: Eating and drinking Follow an eating plan as told by your provider. You may need to avoid certain foods and drinks. These may include: Coffee and tea, with or without caffeine. Alcohol. Energy drinks and sports drinks. Fizzy drinks or sodas. Chocolate and cocoa. Peppermint and mint flavorings. Garlic and onions. Horseradish. Spicy and acidic foods. These include: Peppers. Chili powder and curry powder. Vinegar. Hot sauces and BBQ sauce. Citrus fruits and juices. These include: Oranges. Lemons. Limes. Tomato-based foods. These include: Red sauce and pizza with red sauce. Chili. Salsa. Fried and fatty foods. These include: Donuts. [place] fries. Potato chips. High-fat dressings. High-fat meats. These include: Hot dogs and sausage. Rib eye steak. Ham and bacon. High-fat dairy items. These include: Whole milk. Butter. Cream cheese. Eat small meals often. Avoid eating big meals. Avoid drinking lots of liquid with your meals. Try not to eat meals during the 2-3 hours before bedtime. Try not to lie down right after you eat. Do not exercise right after you eat. Lifestyle  If you're overweight, lose an amount of weight that's healthy for you. Ask your provider about a safe weight loss goal. Do not smoke, vape, or use nicotine or tobacco. Wear  loose clothes. Do not wear things that are tight around your waist. When you sleep, try: Raising the head of your bed about 6 inches (15 cm). You can use a wedge to do this. Lying down on your left side. Try to lower your stress. If you need help doing  this, ask your provider. General instructions Take your medicines only as told. Do not take aspirin or ibuprofen unless you're told to. Watch for any changes in your symptoms. Do not bend over if it makes your symptoms worse. Contact a health care provider if: You have new symptoms. You have trouble: Drinking. Swallowing. Eating. It hurts to swallow. You have wheezing. You have a cough that won't go away. Your voice is hoarse. Your symptoms don't get better with treatment. Get help right away if: You have pain all of a sudden in your: Arm. Neck. Jaw. Teeth. Back. You feel sweaty, dizzy, or light-headed all of a sudden. You faint. You have chest pain or shortness of breath. You vomit and the vomit is: Green, yellow, or black. Looks like blood or coffee grounds. Your poop is red, bloody, or black. These symptoms may be an emergency. Call 911 right away. Do not wait to see if the symptoms will go away. Do not drive yourself to the hospital. This information is not intended to replace advice given to you by your health care provider. Make sure you discuss any questions you have with your health care provider. Document Revised: 08/27/2023 Document Reviewed: 03/13/2023 Elsevier Patient Education  2024 [employer].

## 2024-02-17 NOTE — Progress Notes (Signed)
 Subjective:    Patient ID: Carolyn Parsons, female    DOB: 03-24-1932, 89 y.o.   MRN: [id number]  Chief Complaint  Patient presents with   Medical Management of Chronic Issues   Pt presents to the office today for  CPE and chronic follow up. She has chronic back pain,  has seen Pain management for injections that has helped in the past.    She aortic atherosclerosis and takes Crestor  20 mg daily.    She is followed by Oncologists for bilateral breast cancer. She is completed radiation and taking hormonal pill for 5 years.    She has osteopenia and takes calcium  and vit D. Her last dexa scan was 12/06/23.  Hypertension This is a chronic problem. The current episode started more than 1 year ago. The problem is unchanged. The problem is uncontrolled. Pertinent negatives include no malaise/fatigue or peripheral edema (once in awhile). Risk factors for coronary artery disease include obesity and sedentary lifestyle. The current treatment provides moderate improvement. Identifiable causes of hypertension include a thyroid  problem.  Gastroesophageal Reflux She complains of belching. This is a chronic problem. The current episode started more than 1 year ago. The problem occurs occasionally. Pertinent negatives include no fatigue. She has tried a PPI for the symptoms. The treatment provided moderate relief.  Thyroid  Problem Presents for follow-up visit. Symptoms include anxiety. Patient reports no constipation, diarrhea or fatigue. The symptoms have been stable.  Hyperlipidemia This is a chronic problem. The current episode started more than 1 year ago. The problem is controlled. Recent lipid tests were reviewed and are normal. Exacerbating diseases include obesity. Current antihyperlipidemic treatment includes statins. The current treatment provides moderate improvement of lipids. Risk factors for coronary artery disease include dyslipidemia, hypertension and a sedentary lifestyle.      Review of  Systems  Constitutional:  Negative for fatigue and malaise/fatigue.  Gastrointestinal:  Negative for constipation and diarrhea.  Psychiatric/Behavioral:  The patient is nervous/anxious.   All other systems reviewed and are negative.  Family History  Problem Relation Age of Onset   Cancer Mother    Cancer Father        Brain    Heart disease Sister    Leukemia Sister    Cancer Brother        Prostate   Hyperlipidemia Daughter    Hypertension Daughter    Heart disease Son    Dementia Sister    Heart disease Son    Hyperlipidemia Son    Hypertension Son    Social History   Socioeconomic History   Marital status: Widowed    Spouse name: Not on file   Number of children: 4   Years of education: Not on file   Highest education level: 8th grade  Occupational History   Occupation: retired    Comment: school bus driver   Tobacco Use   Smoking status: Former    Current packs/day: 0.00    Average packs/day: 0.3 packs/day for 20.0 years (5.0 ttl pk-yrs)    Types: Cigarettes    Start date: 10/29/1973    Quit date: 10/29/1993    Years since quitting: 30.3   Smokeless tobacco: Never  Vaping Use   Vaping status: Never Used  Substance and Sexual Activity   Alcohol use: Never   Drug use: Never   Sexual activity: Not on file  Other Topics Concern   Not on file  Social History Narrative   Not on file   Social Drivers of Health  Financial Resource Strain: Low Risk  (12/01/2018)   Overall Financial Resource Strain (CARDIA)    Difficulty of Paying Living Expenses: Not hard at all  Food Insecurity: No Food Insecurity (07/23/2023)   Hunger Vital Sign    Worried About Running Out of Food in the Last Year: Never true    Ran Out of Food in the Last Year: Never true  Transportation Needs: No Transportation Needs (07/23/2023)   PRAPARE - Administrator, Civil Service (Medical): No    Lack of Transportation (Non-Medical): No  Physical Activity: Inactive (12/01/2018)   Exercise  Vital Sign    Days of Exercise per Week: 0 days    Minutes of Exercise per Session: 0 min  Stress: Not on file  Social Connections: Moderately Isolated (12/01/2018)   Social Connection and Isolation Panel [NHANES]    Frequency of Communication with Friends and Family: Once a week    Frequency of Social Gatherings with Friends and Family: More than three times a week    Attends Religious Services: Never    Database administrator or Organizations: No    Attends Banker Meetings: Never    Marital Status: Widowed       Objective:   Physical Exam Vitals reviewed.  Constitutional:      General: She is not in acute distress.    Appearance: She is well-developed. She is obese.  HENT:     Head: Normocephalic and atraumatic.     Right Ear: Tympanic membrane normal.     Left Ear: Tympanic membrane normal.  Eyes:     Pupils: Pupils are equal, round, and reactive to light.  Neck:     Thyroid : No thyromegaly.  Cardiovascular:     Rate and Rhythm: Normal rate and regular rhythm.     Heart sounds: Normal heart sounds. No murmur heard. Pulmonary:     Effort: Pulmonary effort is normal. No respiratory distress.     Breath sounds: Normal breath sounds. No wheezing.  Abdominal:     General: Bowel sounds are normal. There is no distension.     Palpations: Abdomen is soft.     Tenderness: There is no abdominal tenderness.  Musculoskeletal:        General: No tenderness. Normal range of motion.     Cervical back: Normal range of motion and neck supple.  Skin:    General: Skin is warm and dry.  Neurological:     Mental Status: She is alert and oriented to person, place, and time.     Cranial Nerves: No cranial nerve deficit.     Deep Tendon Reflexes: Reflexes are normal and symmetric.  Psychiatric:        Behavior: Behavior normal.        Thought Content: Thought content normal.        Judgment: Judgment normal.      BP (!) 152/69   Pulse (!) 52   Temp (!) 97.1 F (36.2  C) (Temporal)   Ht 4\' 11"  (1.499 m)   Wt 163 lb 3.2 oz (74 kg)   SpO2 97%   BMI 32.96 kg/m       Assessment & Plan:  Carolyn Parsons comes in today with chief complaint of Medical Management of Chronic Issues   Diagnosis and orders addressed:  1. Hyperlipidemia, unspecified hyperlipidemia type - CMP14+EGFR - CBC with Differential/Platelet - Lipid panel  2. Gastroesophageal reflux disease, unspecified whether esophagitis present Will decrease Protonix  to 20 mg  from mg  -Diet discussed- Avoid fried, spicy, citrus foods, caffeine and alcohol -Do not eat 2-3 hours before bedtime -Encouraged small frequent meals -Avoid NSAID's  - CMP14+EGFR - CBC with Differential/Platelet - pantoprazole  (PROTONIX ) 20 MG tablet; Take 1 tablet (20 mg total) by mouth daily.  Dispense: 90 tablet; Refill: 2  3. Primary hypertension Will increase lisinopril  to 20 mg from 10 mg  -Daily blood pressure log given with instructions on how to fill out and told to bring to next visit -Dash diet information given -Exercise encouraged - Stress Management  -Continue current meds - lisinopril  (ZESTRIL ) 20 MG tablet; Take 1 tablet (20 mg total) by mouth daily.  Dispense: 90 tablet; Refill: 3 - CMP14+EGFR - CBC with Differential/Platelet  4. Obesity (BMI 30.0-34.9)  - CMP14+EGFR - CBC with Differential/Platelet  5. Hypothyroidism, unspecified type  - CMP14+EGFR - CBC with Differential/Platelet - TSH  6. Aortic atherosclerosis (HCC) - CMP14+EGFR - CBC with Differential/Platelet  7. Malignant neoplasm of upper-outer quadrant of right breast in female, estrogen receptor positive (HCC) - CMP14+EGFR - CBC with Differential/Platelet  8. Annual physical exam (Primary) - CMP14+EGFR - CBC with Differential/Platelet - Lipid panel - TSH - VITAMIN D  25 Hydroxy (Vit-D Deficiency, Fractures)  9. Osteopenia, unspecified location - CMP14+EGFR - CBC with Differential/Platelet - VITAMIN D  25 Hydroxy (Vit-D  Deficiency, Fractures)   Labs pending Will increase lisinopril  to 20 mg from 10 mg Will decrease Protonix  to 20 mg from mg  Health Maintenance reviewed Diet and exercise encouraged  Follow up plan: 1 month for HTN and GERD   Tommas Fragmin, FNP

## 2024-02-18 ENCOUNTER — Inpatient Hospital Stay: Payer: Medicare HMO | Attending: Hematology and Oncology | Admitting: Hematology and Oncology

## 2024-02-18 VITALS — BP 118/60 | HR 62 | Temp 97.8°F | Resp 20 | Ht 59.0 in | Wt 164.0 lb

## 2024-02-18 DIAGNOSIS — M8580 Other specified disorders of bone density and structure, unspecified site: Secondary | ICD-10-CM | POA: Diagnosis not present

## 2024-02-18 DIAGNOSIS — Z170 Estrogen receptor positive status [ER+]: Secondary | ICD-10-CM | POA: Diagnosis not present

## 2024-02-18 DIAGNOSIS — Z79811 Long term (current) use of aromatase inhibitors: Secondary | ICD-10-CM | POA: Diagnosis not present

## 2024-02-18 DIAGNOSIS — C50411 Malignant neoplasm of upper-outer quadrant of right female breast: Secondary | ICD-10-CM | POA: Diagnosis present

## 2024-02-18 LAB — CBC WITH DIFFERENTIAL/PLATELET
Basophils Absolute: 0 10*3/uL (ref 0.0–0.2)
Basos: 0 %
EOS (ABSOLUTE): 0.3 10*3/uL (ref 0.0–0.4)
Eos: 4 %
Hematocrit: 39.6 % (ref 34.0–46.6)
Hemoglobin: 13.1 g/dL (ref 11.1–15.9)
Immature Grans (Abs): 0 10*3/uL (ref 0.0–0.1)
Immature Granulocytes: 0 %
Lymphocytes Absolute: 1.9 10*3/uL (ref 0.7–3.1)
Lymphs: 27 %
MCH: 31.4 pg (ref 26.6–33.0)
MCHC: 33.1 g/dL (ref 31.5–35.7)
MCV: 95 fL (ref 79–97)
Monocytes Absolute: 0.6 10*3/uL (ref 0.1–0.9)
Monocytes: 8 %
Neutrophils Absolute: 4.3 10*3/uL (ref 1.4–7.0)
Neutrophils: 61 %
Platelets: 181 10*3/uL (ref 150–450)
RBC: 4.17 x10E6/uL (ref 3.77–5.28)
RDW: 12.8 % (ref 11.7–15.4)
WBC: 7.1 10*3/uL (ref 3.4–10.8)

## 2024-02-18 LAB — CMP14+EGFR
ALT: 10 IU/L (ref 0–32)
AST: 16 IU/L (ref 0–40)
Albumin: 4.1 g/dL (ref 3.7–4.7)
Alkaline Phosphatase: 88 IU/L (ref 44–121)
BUN/Creatinine Ratio: 21 (ref 12–28)
BUN: 18 mg/dL (ref 8–27)
Bilirubin Total: 0.3 mg/dL (ref 0.0–1.2)
CO2: 21 mmol/L (ref 20–29)
Calcium: 9.4 mg/dL (ref 8.7–10.3)
Chloride: 104 mmol/L (ref 96–106)
Creatinine, Ser: 0.87 mg/dL (ref 0.57–1.00)
Globulin, Total: 2.4 g/dL (ref 1.5–4.5)
Glucose: 103 mg/dL — ABNORMAL HIGH (ref 70–99)
Potassium: 4.6 mmol/L (ref 3.5–5.2)
Sodium: 141 mmol/L (ref 134–144)
Total Protein: 6.5 g/dL (ref 6.0–8.5)
eGFR: 64 mL/min/{1.73_m2} (ref >59)

## 2024-02-18 LAB — VITAMIN D 25 HYDROXY (VIT D DEFICIENCY, FRACTURES): Vit D, 25-Hydroxy: 55.3 ng/mL (ref 30.0–100.0)

## 2024-02-18 LAB — LIPID PANEL
Cholesterol, Total: 134 mg/dL (ref 100–199)
HDL: 40 mg/dL (ref >39)
LDL CALC COMMENT:: 3.4 ratio (ref 0.0–4.4)
LDL Chol Calc (NIH): 72 mg/dL (ref 0–99)
Triglycerides: 119 mg/dL (ref 0–149)
VLDL Cholesterol Cal: 22 mg/dL (ref 5–40)

## 2024-02-18 LAB — TSH: TSH: 3.09 u[IU]/mL (ref 0.450–4.500)

## 2024-02-18 NOTE — Progress Notes (Signed)
 Patient Care Team: Yevette Hem, FNP as PCP - General (Family Medicine) Cameron Cea, MD as Consulting Physician (Hematology and Oncology) Enid Harry, MD as Consulting Physician (General Surgery) Colie Dawes, MD as Attending Physician (Radiation Oncology)  DIAGNOSIS:  Encounter Diagnosis  Name Primary?   Malignant neoplasm of upper-outer quadrant of right breast in female, estrogen receptor positive (HCC) Yes    SUMMARY OF ONCOLOGIC HISTORY: Oncology History  Malignant neoplasm of upper-outer quadrant of right breast in female, estrogen receptor positive (HCC)  01/25/2023 Initial Diagnosis   Palpable abnormality right breast retroareolar 11 o'clock position: 1.4 cm, adjacent mass 0.4 cm, axilla negative: Biopsy grade 2 IDC ER 95%, PR 20%, HER2 2+ by IHC FISH negative Left breast spiculated mass 7 mm at 1:00, 1.6 cm at 12:00, 2 masses at 11 o'clock position biopsy revealed grade 2 invasive mammary cancer with papillary features ER?,  PR 100%, Ki67 5%, HER2 2+ by IHC, FISH negative 0.7 cm (benign) and 0.5 cm (not biopsied)   03/26/2023 Surgery   Right lumpectomy: Grade 2 IDC 2 cm with high-grade DCIS, margins negative, ER 95%, PR 100%, HER2 negative, Ki-67 10% Left lumpectomy: Grade 2 IDC 2.5 cm, separate satellite focus IDC 0.8 cm, margins negative, lymphovascular invasion identified, ER 95%, PR 100%, HER2 1+ negative, Ki-67 5%   03/26/2023 Cancer Staging   Staging form: Breast, AJCC 8th Edition - Pathologic stage from 03/26/2023: Stage IA (pT1c, pN0, cM0, G2, ER+, PR+, HER2-) - Signed by Percival Brace, NP on 08/22/2023 Stage prefix: Initial diagnosis Histologic grading system: 3 grade system   03/2023 -  Anti-estrogen oral therapy   Anastrozole    08/06/2023 - 08/20/2023 Radiation Therapy   Right and Left Breast: 17.1 Gy in 3 treatments   Malignant neoplasm of overlapping sites of left breast in female, estrogen receptor positive (HCC)  02/22/2023 Initial  Diagnosis   Malignant neoplasm of overlapping sites of left breast in female, estrogen receptor positive (HCC)   03/26/2023 Cancer Staging   Staging form: Breast, AJCC 8th Edition - Pathologic stage from 03/26/2023: Stage IA (pT2, pN0, cM0, G2, ER+, PR+, HER2-) - Signed by Percival Brace, NP on 08/22/2023 Stage prefix: Initial diagnosis Histologic grading system: 3 grade system     CHIEF COMPLIANT: Surveillance of breast cancer on anastrozole  therapy  HISTORY OF PRESENT ILLNESS:  History of Present Illness The patient, with a history of breast cancer, has been on anastrozole  since June of the previous year. She reports no adverse effects from the medication, including hot flashes or joint stiffness. The patient's bone density was recently tested, revealing strong bones, particularly in the spine. The patient's mammograms have been reported as normal, with a B density rating. The patient also takes calcium  supplements for bone health. The patient plans to travel for three to four months and has enough medication refills to last until the following year.     ALLERGIES:  is allergic to amoxicillin and penicillins.  MEDICATIONS:  Current Outpatient Medications  Medication Sig Dispense Refill   amLODipine  (NORVASC ) 10 MG tablet Take 1 tablet (10 mg total) by mouth daily. 90 tablet 2   anastrozole  (ARIMIDEX ) 1 MG tablet TAKE 1 TABLET BY MOUTH DAILY 90 tablet 3   brimonidine -timolol  (COMBIGAN ) 0.2-0.5 % ophthalmic solution INSTILL 1 DROP INTO BOTH EYES  EVERY 12 HOURS 25 mL 3   calcium  carbonate (OSCAL) 1500 (600 Ca) MG TABS tablet Take by mouth 2 (two) times daily with a meal.     Cholecalciferol (  VITAMIN D3) 50 MCG (2000 UT) capsule Take 2,000 Units by mouth daily.     fexofenadine  (ALLEGRA ) 180 MG tablet Take 1 tablet (180 mg total) by mouth daily. 90 tablet 2   fluticasone  (FLONASE ) 50 MCG/ACT nasal spray Place 1 spray into both nostrils daily. 48 g 3   levothyroxine  (SYNTHROID )  50 MCG tablet TAKE 1 TABLET BY MOUTH DAILY 90 tablet 3   lisinopril  (ZESTRIL ) 20 MG tablet Take 1 tablet (20 mg total) by mouth daily. 90 tablet 3   metoprolol  succinate (TOPROL -XL) 25 MG 24 hr tablet TAKE 1 TABLET BY MOUTH DAILY  NEEDS TO BE SEEN BEFORE NEXT  REFILL 90 tablet 0   Multiple Vitamins-Minerals (MULTIVITAMIN WITH MINERALS) tablet Take 1 tablet by mouth daily.     pantoprazole  (PROTONIX ) 20 MG tablet Take 1 tablet (20 mg total) by mouth daily. 90 tablet 2   rosuvastatin  (CRESTOR ) 20 MG tablet TAKE 1 TABLET BY MOUTH DAILY 90 tablet 0   No current facility-administered medications for this visit.    PHYSICAL EXAMINATION: ECOG PERFORMANCE STATUS: 1 - Symptomatic but completely ambulatory  Vitals:   02/18/24 1041  BP: 118/60  Pulse: 62  Resp: 20  Temp: 97.8 F (36.6 C)  SpO2: 98%   Filed Weights   02/18/24 1041  Weight: 164 lb (74.4 kg)     LABORATORY DATA:  I have reviewed the data as listed    Latest Ref Rng & Units 02/17/2024    8:47 AM 10/25/2023    8:45 AM 02/26/2023   11:48 AM  CMP  Glucose 70 - 99 mg/dL 161  096  045   BUN 8 - 27 mg/dL 18  19  16    Creatinine 0.57 - 1.00 mg/dL 4.09  8.11  9.14   Sodium 134 - 144 mmol/L 141  142  143   Potassium 3.5 - 5.2 mmol/L 4.6  4.5  4.8   Chloride 96 - 106 mmol/L 104  105  103   CO2 20 - 29 mmol/L 21  23  23    Calcium  8.7 - 10.3 mg/dL 9.4  9.4  9.5   Total Protein 6.0 - 8.5 g/dL 6.5   6.9   Total Bilirubin 0.0 - 1.2 mg/dL 0.3   0.6   Alkaline Phos 44 - 121 IU/L 88   76   AST 0 - 40 IU/L 16   18   ALT 0 - 32 IU/L 10   14     Lab Results  Component Value Date   WBC 7.1 02/17/2024   HGB 13.1 02/17/2024   HCT 39.6 02/17/2024   MCV 95 02/17/2024   PLT 181 02/17/2024   NEUTROABS 4.3 02/17/2024    ASSESSMENT & PLAN:  Malignant neoplasm of upper-outer quadrant of right breast in female, estrogen receptor positive (HCC) Right lumpectomy: Grade 2 IDC 2 cm with high-grade DCIS, margins negative, ER 95%, PR 100%,  HER2 negative, Ki-67 10% Left lumpectomy: Grade 2 IDC 2.5 cm, separate satellite focus IDC 0.8 cm, margins negative, lymphovascular invasion identified, ER 95%, PR 100%, HER2 1+ negative, Ki-67 5%   Pathology counseling: I discussed the final pathology report of the patient provided  a copy of this report. I discussed the margins as well as lymph node surgeries. We also discussed the final staging along with previously performed ER/PR and HER-2/neu testing.   Treatment plan: Adjuvant radiation therapy 08/13/2023-09/03/2023 Adjuvant antiestrogen therapy: With anastrozole  started 04/03/2023   Anastrozole  toxicities: Denies any hot flashes or arthralgias  or myalgias. She goes to [place]  where a lot of her family resides.  Breast cancer surveillance: Breast exam 02/16/2019 2025: Benign Mammogram 02/06/2024: Benign breast density category B Bone density 12/06/2023: T-score -1.3: Mild osteopenia recommend calcium  and vitamin D  with weightbearing exercises  Return to clinic in 1 year for follow-up     No orders of the defined types were placed in this encounter.  The patient has a good understanding of the overall plan. she agrees with it. she will call with any problems that may develop before the next visit here. Total time spent: 30 mins including face to face time and time spent for planning, charting and co-ordination of care   Margert Sheerer, MD 02/18/24

## 2024-02-18 NOTE — Assessment & Plan Note (Signed)
 Right lumpectomy: Grade 2 IDC 2 cm with high-grade DCIS, margins negative, ER 95%, PR 100%, HER2 negative, Ki-67 10% Left lumpectomy: Grade 2 IDC 2.5 cm, separate satellite focus IDC 0.8 cm, margins negative, lymphovascular invasion identified, ER 95%, PR 100%, HER2 1+ negative, Ki-67 5%   Pathology counseling: I discussed the final pathology report of the patient provided  a copy of this report. I discussed the margins as well as lymph node surgeries. We also discussed the final staging along with previously performed ER/PR and HER-2/neu testing.   Treatment plan: Adjuvant radiation therapy 08/13/2023-09/03/2023 Adjuvant antiestrogen therapy: With anastrozole  started 04/03/2023   Anastrozole  toxicities:  Breast cancer surveillance: Breast exam 02/16/2019 2025: Benign Mammogram 02/06/2024: Benign breast density category B Bone density 12/06/2023: T-score -1.3: Mild osteopenia recommend calcium  and vitamin D  with weightbearing exercises  Return to clinic in 1 year for follow-up

## 2024-02-28 ENCOUNTER — Other Ambulatory Visit: Payer: Self-pay | Admitting: Family

## 2024-02-28 DIAGNOSIS — K219 Gastro-esophageal reflux disease without esophagitis: Secondary | ICD-10-CM

## 2024-02-28 DIAGNOSIS — I10 Essential (primary) hypertension: Secondary | ICD-10-CM

## 2024-02-28 DIAGNOSIS — J301 Allergic rhinitis due to pollen: Secondary | ICD-10-CM

## 2024-03-26 ENCOUNTER — Other Ambulatory Visit: Payer: Self-pay | Admitting: Family

## 2024-03-26 DIAGNOSIS — E785 Hyperlipidemia, unspecified: Secondary | ICD-10-CM

## 2024-03-26 DIAGNOSIS — I10 Essential (primary) hypertension: Secondary | ICD-10-CM

## 2024-03-31 ENCOUNTER — Ambulatory Visit (INDEPENDENT_AMBULATORY_CARE_PROVIDER_SITE_OTHER): Admitting: Family

## 2024-03-31 ENCOUNTER — Encounter: Payer: Self-pay | Admitting: Family

## 2024-03-31 VITALS — BP 135/56 | HR 54 | Temp 98.1°F | Ht 59.0 in | Wt 163.0 lb

## 2024-03-31 DIAGNOSIS — K5900 Constipation, unspecified: Secondary | ICD-10-CM

## 2024-03-31 DIAGNOSIS — K219 Gastro-esophageal reflux disease without esophagitis: Secondary | ICD-10-CM | POA: Diagnosis not present

## 2024-03-31 DIAGNOSIS — I10 Essential (primary) hypertension: Secondary | ICD-10-CM

## 2024-03-31 NOTE — Patient Instructions (Signed)

## 2024-03-31 NOTE — Progress Notes (Signed)
 Subjective:    Patient ID: Carolyn Parsons, female    DOB: 08-Oct-1932, 89 y.o.   MRN: [id number]  Chief Complaint  Patient presents with   Follow-up    HTN and GERD    Pt presents to the office today to recheck HTN and GERD.   She was seen on 02/17/24 and we increased her lisinopril  to 20 mg from 10 mg. Her BP is at goal today!  We also decreased her Protonix  to 20 mg from 40 mg. She has not done this yet, because she used up her medications.  Hypertension This is a chronic problem. The current episode started more than 1 year ago. The problem has been resolved since onset. The problem is controlled. Pertinent negatives include no malaise/fatigue or peripheral edema (once in awhile). Risk factors for coronary artery disease include obesity and sedentary lifestyle. Past treatments include ACE inhibitors. The current treatment provides moderate improvement.  Gastroesophageal Reflux She complains of belching. She reports no heartburn. This is a chronic problem. The current episode started more than 1 year ago. The problem occurs occasionally. She has tried a PPI for the symptoms. The treatment provided moderate relief.  Constipation This is a new problem. The current episode started more than 1 month ago. Her stool frequency is 2 to 3 times per week. She has tried diet changes and stool softeners for the symptoms. The treatment provided mild relief.      Review of Systems  Constitutional:  Negative for malaise/fatigue.  Gastrointestinal:  Positive for constipation. Negative for heartburn.  All other systems reviewed and are negative.  Family History  Problem Relation Age of Onset   Cancer Mother    Cancer Father        Brain    Heart disease Sister    Leukemia Sister    Cancer Brother        Prostate   Hyperlipidemia Daughter    Hypertension Daughter    Heart disease Son    Dementia Sister    Heart disease Son    Hyperlipidemia Son    Hypertension Son    Social History    Socioeconomic History   Marital status: Widowed    Spouse name: Not on file   Number of children: 4   Years of education: Not on file   Highest education level: 8th grade  Occupational History   Occupation: retired    Comment: school bus driver   Tobacco Use   Smoking status: Former    Current packs/day: 0.00    Average packs/day: 0.3 packs/day for 20.0 years (5.0 ttl pk-yrs)    Types: Cigarettes    Start date: 10/29/1973    Quit date: 10/29/1993    Years since quitting: 30.4   Smokeless tobacco: Never  Vaping Use   Vaping status: Never Used  Substance and Sexual Activity   Alcohol use: Never   Drug use: Never   Sexual activity: Not on file  Other Topics Concern   Not on file  Social History Narrative   Not on file   Social Drivers of Health   Financial Resource Strain: Low Risk  (12/01/2018)   Overall Financial Resource Strain (CARDIA)    Difficulty of Paying Living Expenses: Not hard at all  Food Insecurity: No Food Insecurity (07/23/2023)   Hunger Vital Sign    Worried About Running Out of Food in the Last Year: Never true    Ran Out of Food in the Last Year: Never true  Transportation  Needs: No Transportation Needs (07/23/2023)   PRAPARE - Administrator, Civil Service (Medical): No    Lack of Transportation (Non-Medical): No  Physical Activity: Inactive (12/01/2018)   Exercise Vital Sign    Days of Exercise per Week: 0 days    Minutes of Exercise per Session: 0 min  Stress: Not on file  Social Connections: Moderately Isolated (12/01/2018)   Social Connection and Isolation Panel [NHANES]    Frequency of Communication with Friends and Family: Once a week    Frequency of Social Gatherings with Friends and Family: More than three times a week    Attends Religious Services: Never    Database administrator or Organizations: No    Attends Banker Meetings: Never    Marital Status: Widowed       Objective:   Physical Exam Vitals reviewed.   Constitutional:      General: She is not in acute distress.    Appearance: She is well-developed. She is obese.  HENT:     Head: Normocephalic and atraumatic.  Eyes:     Pupils: Pupils are equal, round, and reactive to light.  Neck:     Thyroid : No thyromegaly.  Cardiovascular:     Rate and Rhythm: Normal rate and regular rhythm.     Heart sounds: Normal heart sounds. No murmur heard. Pulmonary:     Effort: Pulmonary effort is normal. No respiratory distress.     Breath sounds: Normal breath sounds. No wheezing.  Abdominal:     General: Bowel sounds are normal. There is no distension.     Palpations: Abdomen is soft.     Tenderness: There is no abdominal tenderness.  Musculoskeletal:        General: No tenderness. Normal range of motion.     Cervical back: Normal range of motion and neck supple.  Skin:    General: Skin is warm and dry.  Neurological:     Mental Status: She is alert and oriented to person, place, and time.     Cranial Nerves: No cranial nerve deficit.     Deep Tendon Reflexes: Reflexes are normal and symmetric.  Psychiatric:        Behavior: Behavior normal.        Thought Content: Thought content normal.        Judgment: Judgment normal.      BP (!) 135/56   Pulse (!) 54   Temp 98.1 F (36.7 C) (Temporal)   Ht 4\' 11"  (1.499 m)   Wt 163 lb (73.9 kg)   SpO2 95%   BMI 32.92 kg/m       Assessment & Plan:  [name] comes in today with chief complaint of Follow-up (HTN and GERD )   Diagnosis and orders addressed:  1. Primary hypertension (Primary) BP at goal! -Daily blood pressure log given with instructions on how to fill out and told to bring to next visit -Dash diet information given -Exercise encouraged - Stress Management  -Continue current meds -RTO in 6 motnsh  - BMP8+EGFR  2. Gastroesophageal reflux disease, unspecified whether esophagitis present Start protonix  at 20 mg  -Diet discussed- Avoid fried, spicy, citrus foods,  caffeine and alcohol -Do not eat 2-3 hours before bedtime -Encouraged small frequent meals -Avoid NSAID's -Let me know if gerd symptoms worsen - BMP8+EGFR  3. Constipation, unspecified constipation type Start stool softener  Encourage activity Force fluid High fiber diet   Labs pending Continue Lisinopril  20 mg  mg Will decrease Protonix  to 20 mg from 40 mg  Health Maintenance reviewed Diet and exercise encouraged  Follow up plan: 6 months    Tommas Fragmin, FNP

## 2024-04-01 LAB — BMP8+EGFR
BUN/Creatinine Ratio: 20 (ref 12–28)
BUN: 23 mg/dL (ref 8–27)
CO2: 22 mmol/L (ref 20–29)
Calcium: 9.5 mg/dL (ref 8.7–10.3)
Chloride: 103 mmol/L (ref 96–106)
Creatinine, Ser: 1.16 mg/dL — ABNORMAL HIGH (ref 0.57–1.00)
Glucose: 102 mg/dL — ABNORMAL HIGH (ref 70–99)
Potassium: 5.3 mmol/L — ABNORMAL HIGH (ref 3.5–5.2)
Sodium: 139 mmol/L (ref 134–144)
eGFR: 45 mL/min/{1.73_m2} — ABNORMAL LOW (ref >59)

## 2024-04-06 ENCOUNTER — Ambulatory Visit: Payer: Self-pay | Admitting: Family

## 2024-06-06 ENCOUNTER — Other Ambulatory Visit: Payer: Self-pay | Admitting: Family

## 2024-06-06 DIAGNOSIS — I10 Essential (primary) hypertension: Secondary | ICD-10-CM

## 2024-06-06 DIAGNOSIS — E785 Hyperlipidemia, unspecified: Secondary | ICD-10-CM

## 2024-07-01 ENCOUNTER — Encounter: Payer: Self-pay | Admitting: *Deleted

## 2024-09-19 ENCOUNTER — Other Ambulatory Visit: Payer: Self-pay | Admitting: Family

## 2024-09-19 DIAGNOSIS — K219 Gastro-esophageal reflux disease without esophagitis: Secondary | ICD-10-CM

## 2024-10-02 ENCOUNTER — Ambulatory Visit: Admitting: Family

## 2024-10-08 ENCOUNTER — Ambulatory Visit: Admitting: Family

## 2024-10-13 ENCOUNTER — Ambulatory Visit: Payer: Self-pay | Admitting: Family

## 2024-10-13 ENCOUNTER — Encounter: Payer: Self-pay | Admitting: Family

## 2024-10-13 DIAGNOSIS — E66811 Obesity, class 1: Secondary | ICD-10-CM

## 2024-10-13 DIAGNOSIS — E785 Hyperlipidemia, unspecified: Secondary | ICD-10-CM | POA: Diagnosis not present

## 2024-10-13 DIAGNOSIS — Z23 Encounter for immunization: Secondary | ICD-10-CM

## 2024-10-13 DIAGNOSIS — E039 Hypothyroidism, unspecified: Secondary | ICD-10-CM

## 2024-10-13 DIAGNOSIS — M8580 Other specified disorders of bone density and structure, unspecified site: Secondary | ICD-10-CM

## 2024-10-13 DIAGNOSIS — I700 Atherosclerosis of aorta: Secondary | ICD-10-CM

## 2024-10-13 DIAGNOSIS — I10 Essential (primary) hypertension: Secondary | ICD-10-CM

## 2024-10-13 DIAGNOSIS — K219 Gastro-esophageal reflux disease without esophagitis: Secondary | ICD-10-CM

## 2024-10-13 LAB — CMP14+EGFR
ALT: 11 IU/L (ref 0–32)
AST: 19 IU/L (ref 0–40)
Albumin: 4.1 g/dL (ref 3.6–4.6)
Alkaline Phosphatase: 84 IU/L (ref 48–129)
BUN/Creatinine Ratio: 20 (ref 12–28)
BUN: 19 mg/dL (ref 10–36)
Bilirubin Total: 0.4 mg/dL (ref 0.0–1.2)
CO2: 24 mmol/L (ref 20–29)
Calcium: 9.5 mg/dL (ref 8.7–10.3)
Chloride: 105 mmol/L (ref 96–106)
Creatinine, Ser: 0.95 mg/dL (ref 0.57–1.00)
Globulin, Total: 2.3 g/dL (ref 1.5–4.5)
Glucose: 105 mg/dL — ABNORMAL HIGH (ref 70–99)
Potassium: 4.7 mmol/L (ref 3.5–5.2)
Sodium: 141 mmol/L (ref 134–144)
Total Protein: 6.4 g/dL (ref 6.0–8.5)
eGFR: 57 mL/min/1.73 — ABNORMAL LOW (ref >59)

## 2024-10-13 LAB — CBC WITH DIFFERENTIAL/PLATELET
Basophils Absolute: 0 x10E3/uL (ref 0.0–0.2)
Basos: 0 %
EOS (ABSOLUTE): 0.4 x10E3/uL (ref 0.0–0.4)
Eos: 5 %
Hematocrit: 39.6 % (ref 34.0–46.6)
Hemoglobin: 13 g/dL (ref 11.1–15.9)
Immature Grans (Abs): 0 x10E3/uL (ref 0.0–0.1)
Immature Granulocytes: 0 %
Lymphocytes Absolute: 2 x10E3/uL (ref 0.7–3.1)
Lymphs: 24 %
MCH: 31.9 pg (ref 26.6–33.0)
MCHC: 32.8 g/dL (ref 31.5–35.7)
MCV: 97 fL (ref 79–97)
Monocytes Absolute: 0.6 x10E3/uL (ref 0.1–0.9)
Monocytes: 7 %
Neutrophils Absolute: 5.5 x10E3/uL (ref 1.4–7.0)
Neutrophils: 64 %
Platelets: 188 x10E3/uL (ref 150–450)
RBC: 4.08 x10E6/uL (ref 3.77–5.28)
RDW: 12.1 % (ref 11.7–15.4)
WBC: 8.5 x10E3/uL (ref 3.4–10.8)

## 2024-10-13 LAB — TSH: TSH: 3.31 u[IU]/mL (ref 0.450–4.500)

## 2024-10-13 MED ORDER — PANTOPRAZOLE SODIUM 40 MG PO TBEC: 40.0000 mg | DELAYED_RELEASE_TABLET | Freq: Every day | ORAL | 2 refills | Status: AC

## 2024-10-13 NOTE — Progress Notes (Signed)
 Subjective:    Patient ID: Carolyn Parsons, female    DOB: 08-16-1932, 89 y.o.   MRN: [id number]  Chief Complaint  Patient presents with   Medical Management of Chronic Issues   Pt presents to the office today for chronic follow up. She has chronic back pain,  has seen Pain management for injections that has helped in the past.    She aortic atherosclerosis and takes Crestor  20 mg daily.    She is followed by Oncologists for bilateral breast cancer. She is completed radiation and taking hormonal pill for 5 years.    She has osteopenia and takes calcium  and vit D. Her last dexa scan was 12/06/23.  Hypertension This is a chronic problem. The current episode started more than 1 year ago. The problem is unchanged. The problem is uncontrolled. Associated symptoms include shortness of breath (When walking). Pertinent negatives include no malaise/fatigue or peripheral edema (once in awhile). Risk factors for coronary artery disease include obesity, sedentary lifestyle and post-menopausal state. The current treatment provides moderate improvement. Identifiable causes of hypertension include a thyroid  problem.  Gastroesophageal Reflux She complains of belching, coughing and heartburn. This is a chronic problem. The current episode started more than 1 year ago. The problem occurs occasionally. The symptoms are aggravated by certain foods. Pertinent negatives include no fatigue. Risk factors include obesity. She has tried a PPI for the symptoms. The treatment provided moderate relief.  Thyroid  Problem Presents for follow-up visit. Symptoms include anxiety and constipation. Patient reports no diarrhea or fatigue. The symptoms have been stable.  Hyperlipidemia This is a chronic problem. The current episode started more than 1 year ago. The problem is controlled. Recent lipid tests were reviewed and are normal. Exacerbating diseases include obesity. Associated symptoms include shortness of breath (When  walking). Current antihyperlipidemic treatment includes statins. The current treatment provides moderate improvement of lipids. Risk factors for coronary artery disease include dyslipidemia, hypertension and a sedentary lifestyle.      Review of Systems  Constitutional:  Negative for fatigue and malaise/fatigue.  Respiratory:  Positive for cough and shortness of breath (When walking).   Gastrointestinal:  Positive for constipation and heartburn. Negative for diarrhea.  Psychiatric/Behavioral:  The patient is nervous/anxious.   All other systems reviewed and are negative.  Family History  Problem Relation Age of Onset   Cancer Mother    Cancer Father        Brain    Heart disease Sister    Leukemia Sister    Cancer Brother        Prostate   Hyperlipidemia Daughter    Hypertension Daughter    Heart disease Son    Dementia Sister    Heart disease Son    Hyperlipidemia Son    Hypertension Son    Social History   Socioeconomic History   Marital status: Widowed    Spouse name: Not on file   Number of children: 4   Years of education: Not on file   Highest education level: 8th grade  Occupational History   Occupation: retired    Comment: school bus driver   Tobacco Use   Smoking status: Former    Current packs/day: 0.00    Average packs/day: 0.3 packs/day for 20.0 years (5.0 ttl pk-yrs)    Types: Cigarettes    Start date: 10/29/1973    Quit date: 10/29/1993    Years since quitting: 30.9   Smokeless tobacco: Never  Vaping Use   Vaping status: Never Used  Substance and Sexual Activity   Alcohol use: Never   Drug use: Never   Sexual activity: Not on file  Other Topics Concern   Not on file  Social History Narrative   Not on file   Social Drivers of Health   Tobacco Use: Medium Risk (10/13/2024)   Patient History    Smoking Tobacco Use: Former    Smokeless Tobacco Use: Never    Passive Exposure: Not on Actuary Strain: Not on file  Food Insecurity:  No Food Insecurity (07/23/2023)   Hunger Vital Sign    Worried About Running Out of Food in the Last Year: Never true    Ran Out of Food in the Last Year: Never true  Transportation Needs: No Transportation Needs (07/23/2023)   PRAPARE - Administrator, Civil Service (Medical): No    Lack of Transportation (Non-Medical): No  Physical Activity: Not on file  Stress: Not on file  Social Connections: Not on file  Depression (PHQ2-9): Low Risk (10/25/2023)   Depression (PHQ2-9)    PHQ-2 Score: 0  Alcohol Screen: Low Risk (07/23/2023)   Alcohol Screen    Last Alcohol Screening Score (AUDIT): 0  Housing: Low Risk (07/23/2023)   Housing    Last Housing Risk Score: 0  Utilities: Not At Risk (07/23/2023)   AHC Utilities    Threatened with loss of utilities: No  Health Literacy: Adequate Health Literacy (07/23/2023)   B1300 Health Literacy    Frequency of need for help with medical instructions: Never       Objective:   Physical Exam Vitals reviewed.  Constitutional:      General: She is not in acute distress.    Appearance: She is well-developed. She is obese.  HENT:     Head: Normocephalic and atraumatic.     Right Ear: Tympanic membrane normal.     Left Ear: Tympanic membrane normal.  Eyes:     Pupils: Pupils are equal, round, and reactive to light.  Neck:     Thyroid : No thyromegaly.  Cardiovascular:     Rate and Rhythm: Normal rate and regular rhythm.     Heart sounds: Normal heart sounds. No murmur heard. Pulmonary:     Effort: Pulmonary effort is normal. No respiratory distress.     Breath sounds: Normal breath sounds. No wheezing.  Abdominal:     General: Bowel sounds are normal. There is no distension.     Palpations: Abdomen is soft.     Tenderness: There is no abdominal tenderness.  Musculoskeletal:        General: No tenderness. Normal range of motion.     Cervical back: Normal range of motion and neck supple.  Skin:    General: Skin is warm and dry.   Neurological:     Mental Status: She is alert and oriented to person, place, and time.     Cranial Nerves: No cranial nerve deficit.     Deep Tendon Reflexes: Reflexes are normal and symmetric.  Psychiatric:        Behavior: Behavior normal.        Thought Content: Thought content normal.        Judgment: Judgment normal.      BP 138/64   Pulse (!) 55   Temp (!) 97.5 F (36.4 C) (Temporal)   Ht 4' 11 (1.499 m)   Wt 166 lb (75.3 kg)   SpO2 97%   BMI 33.53 kg/m  Assessment & Plan:  Carolyn Parsons comes in today with chief complaint of Medical Management of Chronic Issues   Diagnosis and orders addressed:  1. Aortic atherosclerosis - CMP14+EGFR - CBC with Differential/Platelet  2. Gastroesophageal reflux disease, unspecified whether esophagitis present - CMP14+EGFR - CBC with Differential/Platelet  3. Hyperlipidemia, unspecified hyperlipidemia type - CMP14+EGFR - CBC with Differential/Platelet  4. Primary hypertension Will increase protonix  to 40 mg from 20 mg -Diet discussed- Avoid fried, spicy, citrus foods, caffeine and alcohol -Do not eat 2-3 hours before bedtime -Encouraged small frequent meals -Avoid NSAID's - pantoprazole  (PROTONIX ) 40 MG tablet; Take 1 tablet (40 mg total) by mouth daily.  Dispense: 90 tablet; Refill: 2 - CMP14+EGFR - CBC with Differential/Platelet  5. Hypothyroidism, unspecified type - CMP14+EGFR - CBC with Differential/Platelet - TSH  6. Obesity (BMI 30.0-34.9) - CMP14+EGFR - CBC with Differential/Platelet  7. Osteopenia, unspecified location  - CMP14+EGFR - CBC with Differential/Platelet  8. Encounter for immunization - Flu vaccine HIGH DOSE PF(Fluzone Trivalent)    Labs pending Will increase Protonix  to 40 mg from 20 mg  Health Maintenance reviewed Diet and exercise encouraged  Follow up plan: 6 months    Bari Learn, FNP

## 2024-10-13 NOTE — Patient Instructions (Signed)
 GERD in Adults: What to Know  Gastroesophageal reflux (GER) is when acid from your stomach flows up into your esophagus. Your esophagus is the part of your body that moves food from your mouth to your stomach. Normally, food goes down and stays in your stomach to be digested. But with GER, food and stomach acid may go back up. You may have a disease called gastroesophageal reflux disease (GERD) if the reflux: Happens often. Causes very bad symptoms. Makes your esophagus sore and swollen. Over time, GERD can make small holes called ulcers in the lining of your esophagus. What are the causes? GERD is caused by a problem with the muscle between your esophagus and stomach. This muscle is called the lower esophageal sphincter (LES). When it's weak or not normal, it doesn't close like it should. This means food and stomach acid can go back up into your esophagus. The muscle can be weak if: You smoke or use products with tobacco in them. You're pregnant. You have a type of hernia called a hiatal hernia. You eat certain foods and drinks. These include: Alcohol. Coffee. Chocolate. Onions. Peppermint. What increases the risk? Being overweight. Having a disease that affects your connective tissue. Taking NSAIDs, such as ibuprofen. What are the signs or symptoms? Heartburn. Trouble swallowing. Pain when you swallow. The feeling of having a lump in your throat. A bitter taste in your mouth. Bad breath. Having an upset or bloated stomach. Burping. Chest pain. Other conditions can also cause chest pain. Make sure you see your health care provider if you have chest pain. Wheezing. This is when you make high-pitched whistling sounds when you breathe, most often when you breathe out. A long-term cough or a cough at night. How is this diagnosed? GERD may be diagnosed based on your medical history and a physical exam. You may also have tests. These may include: An endoscopy. This test looks at your  stomach and esophagus with a small camera. A barium swallow test. This shows the shape and size of your esophagus and how well it's working. Tests of your esophagus to check for: Acid levels. Pressure. How is this treated? Treatment may depend on how bad your symptoms are. It may include: Changes to your diet and daily life. Medicines. Surgery. Follow these instructions at home: Eating and drinking Follow an eating plan as told by your provider. You may need to avoid certain foods and drinks. These may include: Coffee and tea, with or without caffeine. Alcohol. Energy drinks and sports drinks. Fizzy drinks or sodas. Chocolate and cocoa. Peppermint and mint flavorings. Garlic and onions. Horseradish. Spicy and acidic foods. These include: Peppers. Chili powder and curry powder. Vinegar. Hot sauces and BBQ sauce. Citrus fruits and juices. These include: Oranges. Lemons. Limes. Tomato-based foods. These include: Red sauce and pizza with red sauce. Chili. Salsa. Fried and fatty foods. These include: Donuts. [place] fries. Potato chips. High-fat dressings. High-fat meats. These include: Hot dogs and sausage. Rib eye steak. Ham and bacon. High-fat dairy items. These include: Whole milk. Butter. Cream cheese. Eat small meals often. Avoid eating big meals. Avoid drinking lots of liquid with your meals. Try not to eat meals during the 2-3 hours before bedtime. Try not to lie down right after you eat. Do not exercise right after you eat. Lifestyle  If you're overweight, lose an amount of weight that's healthy for you. Ask your provider about a safe weight loss goal. Do not smoke, vape, or use nicotine or tobacco. Wear  loose clothes. Do not wear things that are tight around your waist. When you sleep, try: Raising the head of your bed about 6 inches (15 cm). You can use a wedge to do this. Lying down on your left side. Try to lower your stress. If you need help doing  this, ask your provider. General instructions Take your medicines only as told. Do not take aspirin or ibuprofen unless you're told to. Watch for any changes in your symptoms. Do not bend over if it makes your symptoms worse. Contact a health care provider if: You have new symptoms. You have trouble: Drinking. Swallowing. Eating. It hurts to swallow. You have wheezing. You have a cough that won't go away. Your voice is hoarse. Your symptoms don't get better with treatment. Get help right away if: You have pain all of a sudden in your: Arm. Neck. Jaw. Teeth. Back. You feel sweaty, dizzy, or light-headed all of a sudden. You faint. You have chest pain or shortness of breath. You vomit and the vomit is: Green, yellow, or black. Looks like blood or coffee grounds. Your poop is red, bloody, or black. These symptoms may be an emergency. Call 911 right away. Do not wait to see if the symptoms will go away. Do not drive yourself to the hospital. This information is not intended to replace advice given to you by your health care provider. Make sure you discuss any questions you have with your health care provider. Document Revised: 08/27/2023 Document Reviewed: 03/13/2023 Elsevier Patient Education  2024 [employer].

## 2024-10-15 ENCOUNTER — Ambulatory Visit: Payer: Self-pay | Admitting: Family

## 2024-11-02 ENCOUNTER — Other Ambulatory Visit: Payer: Self-pay | Admitting: Hematology and Oncology

## 2024-11-19 ENCOUNTER — Other Ambulatory Visit: Payer: Self-pay | Admitting: Family

## 2024-11-19 DIAGNOSIS — I10 Essential (primary) hypertension: Secondary | ICD-10-CM

## 2025-02-18 ENCOUNTER — Ambulatory Visit: Admitting: Hematology and Oncology

## 2025-04-13 ENCOUNTER — Ambulatory Visit: Admitting: Family
